# Patient Record
Sex: Male | Born: 1990 | Race: White | Hispanic: No | Marital: Married | Smoking: Never smoker
Health system: Southern US, Community
[De-identification: ages and names within clinical notes are randomized; demographics above are authoritative.]

## PROBLEM LIST (undated history)

## (undated) DIAGNOSIS — N200 Calculus of kidney: Secondary | ICD-10-CM

## (undated) HISTORY — PX: CYST REMOVAL PEDIATRIC: SHX6282

---

## 2013-07-10 ENCOUNTER — Encounter (HOSPITAL_COMMUNITY): Payer: Self-pay

## 2013-07-10 ENCOUNTER — Emergency Department (HOSPITAL_COMMUNITY)
Admission: EM | Admit: 2013-07-10 | Discharge: 2013-07-10 | Disposition: A | Discharge status: Home / Self Care | Payer: Self-pay | Attending: Emergency Medicine | Admitting: Emergency Medicine

## 2013-07-10 DIAGNOSIS — H60399 Other infective otitis externa, unspecified ear: Secondary | ICD-10-CM | POA: Insufficient documentation

## 2013-07-10 DIAGNOSIS — H6092 Unspecified otitis externa, left ear: Secondary | ICD-10-CM

## 2013-07-10 DIAGNOSIS — M436 Torticollis: Secondary | ICD-10-CM | POA: Insufficient documentation

## 2013-07-10 DIAGNOSIS — R599 Enlarged lymph nodes, unspecified: Secondary | ICD-10-CM | POA: Insufficient documentation

## 2013-07-10 DIAGNOSIS — H9210 Otorrhea, unspecified ear: Secondary | ICD-10-CM | POA: Insufficient documentation

## 2013-07-10 DIAGNOSIS — F17200 Nicotine dependence, unspecified, uncomplicated: Secondary | ICD-10-CM | POA: Insufficient documentation

## 2013-07-10 MED ORDER — NEOMYCIN-POLYMYXIN-HC 3.5-10000-1 OT SOLN
4.0000 [drp] | Freq: Once | OTIC | Status: AC
  Administered 2013-07-10: 4 [drp] via OTIC
  Filled 2013-07-10: qty 10

## 2013-07-10 MED ORDER — IBUPROFEN 600 MG PO TABS: 600.0000 mg | ORAL_TABLET | Freq: Four times a day (QID) | ORAL | Status: DC | PRN

## 2013-07-10 MED ORDER — IBUPROFEN 800 MG PO TABS
800.0000 mg | ORAL_TABLET | Freq: Once | ORAL | Status: AC
  Administered 2013-07-10: 800 mg via ORAL
  Filled 2013-07-10: qty 1

## 2013-07-10 MED ORDER — METHOCARBAMOL 500 MG PO TABS: 1000.0000 mg | ORAL_TABLET | Freq: Four times a day (QID) | ORAL | Status: AC

## 2013-07-10 NOTE — ED Provider Notes (Signed)
Medical screening examination/treatment/procedure(s) were performed by non-physician practitioner and as supervising physician I was immediately available for consultation/collaboration.   Mikaili Flippin M Lael Wetherbee, DO 07/10/13 2207 

## 2013-07-10 NOTE — ED Provider Notes (Signed)
CSN: [id number]     Arrival date & time 07/10/13  0804 History     First MD Initiated Contact with Patient 07/10/13 0818     Chief Complaint  Patient presents with  . Neck Pain   (Consider location/radiation/quality/duration/timing/severity/associated sxs/prior Treatment) HPI Comments: Miguel Spears is a 22 y.o. Male presenting with a one-day history of left-sided neck pain.  He was working at his job using a power washer when he developed gradual onset of left sided neck pain which became suddenly  worse while at lunch yesterday.  He has difficulty turning head secondary to pain and muscle spasm.  Additionally he describes a white to yellow watery discharge from his left ear and noticed a swollen lymph node underneath his left ear which also started yesterday.  He denies fevers or chills, no headache or sore throat, nasal congestion cough or shortness of breath.  He took Benadryl yesterday evening without relief of symptoms.   The history is provided by the patient.    History reviewed. No pertinent past medical history. Past Surgical History  Procedure Laterality Date  . Cyst removal pediatric     No family history on file. History  Substance Use Topics  . Smoking status: Current Every Day Smoker  . Smokeless tobacco: Not on file  . Alcohol Use: No    Review of Systems  Constitutional: Negative for fever.  HENT: Positive for neck pain, neck stiffness and ear discharge. Negative for ear pain, congestion, sore throat, facial swelling, rhinorrhea and tinnitus.   Eyes: Negative.   Respiratory: Negative for chest tightness and shortness of breath.   Cardiovascular: Negative for chest pain.  Gastrointestinal: Negative for nausea and abdominal pain.  Genitourinary: Negative.   Musculoskeletal: Negative for joint swelling and arthralgias.  Skin: Negative.  Negative for rash and wound.  Neurological: Negative for dizziness, weakness, light-headedness, numbness and headaches.   Psychiatric/Behavioral: Negative.     Allergies  Pineapple and Coconut flavor  Home Medications   Current Outpatient Rx  Name  Route  Sig  Dispense  Refill  . diphenhydrAMINE (BENADRYL) 25 mg capsule   Oral   Take 50 mg by mouth every 6 (six) hours as needed for itching.         Marland Kitchen ibuprofen (ADVIL,MOTRIN) 600 MG tablet   Oral   Take 1 tablet (600 mg total) by mouth every 6 (six) hours as needed for pain.   20 tablet   0   . methocarbamol (ROBAXIN) 500 MG tablet   Oral   Take 2 tablets (1,000 mg total) by mouth 4 (four) times daily.   40 tablet   0    BP 125/68  Pulse 54  Temp(Src) 98.2 F (36.8 C) (Oral)  Resp 20  Ht 5\' 11"  (1.803 m)  Wt 160 lb (72.576 kg)  BMI 22.33 kg/m2  SpO2 100% Physical Exam  Constitutional: He is oriented to person, place, and time. He appears well-developed and well-nourished.  HENT:  Head: Normocephalic and atraumatic.  Right Ear: Tympanic membrane and ear canal normal.  Left Ear: Tympanic membrane normal. There is drainage and tenderness. Tympanic membrane is not erythematous, not retracted and not bulging.  Nose: Mucosal edema and rhinorrhea present.  Mouth/Throat: Uvula is midline, oropharynx is clear and moist and mucous membranes are normal. No oropharyngeal exudate, posterior oropharyngeal edema, posterior oropharyngeal erythema or tonsillar abscesses.  Eyes: Conjunctivae are normal.  Neck: Muscular tenderness present. No rigidity. Decreased range of motion present.  Tender to palpation along  left SCM.  Increased pain along the SCM with leftward rotation of head.  Cardiovascular: Normal rate and normal heart sounds.   Pulmonary/Chest: Effort normal. No respiratory distress. He has no wheezes. He has no rales.  Abdominal: Soft. There is no tenderness.  Lymphadenopathy:    He has cervical adenopathy.  Neurological: He is alert and oriented to person, place, and time.  Skin: Skin is warm and dry. No rash noted.  Psychiatric: He  has a normal mood and affect.    ED Course   Procedures (including critical care time)  Labs Reviewed - No data to display No results found. 1. Otitis externa of left ear   2. Torticollis, acute     MDM  Pt was placed on Cortisporin drops for left ear, first dose given here in bottle given to patient.  He was encouraged ibuprofen and Robaxin for his muscle spasm, she had followed by gentle range of motion several times daily..  Followup anticipated.  The patient appears reasonably screened and/or stabilized for discharge and I doubt any other medical condition or other [facility] requiring further screening, evaluation, or treatment in the ED at this time prior to discharge.   Burgess Amor, PA-C 07/10/13 [phone]

## 2013-07-10 NOTE — ED Notes (Signed)
Pt reports has a knot on left side of neck since yesterday.  Reports history of "cysts" that have required surgery to remove.

## 2013-08-10 ENCOUNTER — Encounter (HOSPITAL_COMMUNITY): Payer: Self-pay | Admitting: *Deleted

## 2013-08-10 ENCOUNTER — Emergency Department (HOSPITAL_COMMUNITY): Payer: Self-pay

## 2013-08-10 ENCOUNTER — Emergency Department (HOSPITAL_COMMUNITY)
Admission: EM | Admit: 2013-08-10 | Discharge: 2013-08-11 | Disposition: A | Discharge status: Home / Self Care | Payer: Self-pay | Attending: Emergency Medicine | Admitting: Emergency Medicine

## 2013-08-10 DIAGNOSIS — R112 Nausea with vomiting, unspecified: Secondary | ICD-10-CM | POA: Insufficient documentation

## 2013-08-10 DIAGNOSIS — Y9239 Other specified sports and athletic area as the place of occurrence of the external cause: Secondary | ICD-10-CM | POA: Insufficient documentation

## 2013-08-10 DIAGNOSIS — F17200 Nicotine dependence, unspecified, uncomplicated: Secondary | ICD-10-CM | POA: Insufficient documentation

## 2013-08-10 DIAGNOSIS — S0285XA Fracture of orbit, unspecified, initial encounter for closed fracture: Secondary | ICD-10-CM

## 2013-08-10 DIAGNOSIS — Y9372 Activity, wrestling: Secondary | ICD-10-CM | POA: Insufficient documentation

## 2013-08-10 DIAGNOSIS — W219XXA Striking against or struck by unspecified sports equipment, initial encounter: Secondary | ICD-10-CM | POA: Insufficient documentation

## 2013-08-10 DIAGNOSIS — S0280XA Fracture of other specified skull and facial bones, unspecified side, initial encounter for closed fracture: Secondary | ICD-10-CM | POA: Insufficient documentation

## 2013-08-10 MED ORDER — ONDANSETRON HCL 4 MG/2ML IJ SOLN
4.0000 mg | Freq: Once | INTRAMUSCULAR | Status: AC
  Administered 2013-08-11: 4 mg via INTRAVENOUS
  Filled 2013-08-10: qty 2

## 2013-08-10 MED ORDER — MORPHINE SULFATE 4 MG/ML IJ SOLN
4.0000 mg | Freq: Once | INTRAMUSCULAR | Status: AC
  Administered 2013-08-11: 4 mg via INTRAVENOUS
  Filled 2013-08-10: qty 1

## 2013-08-10 NOTE — ED Notes (Signed)
Pt to department via EMS.  Per report, pt was in a wrestling match, during fight he was struck in the right eye with his knee.  Pt ambulatory at scene.  No loc.

## 2013-08-10 NOTE — ED Provider Notes (Signed)
CSN: [id number]     Arrival date & time 08/10/13  2217 History  This chart was scribed for Dione Booze, MD by Henri Medal, ED Scribe. This patient was seen in room APA09/APA09 and the patient's care was started at 11:45 PM.    Chief Complaint  Patient presents with  . Facial Injury   The history is provided by the patient and a parent. No language interpreter was used.   HPI Comments: Miguel Spears is a 22 y.o. male who presents to the Emergency Department complaining of constant, gradually worsening, "9/10" left sided facial pain onset today while wrestling with friends. Pt states that he was "suplexed" and that his knee struck him in his left eye. Pt reports associated left-sided facial numbness which is improving, nausea with 3 episodes of emesis, blurred vision in his left eye and "side-by-side" double vision onset after the injury. Pt is alert and oriented to location and time. He denies neck pain, back pain or any other symptoms.   History reviewed. No pertinent past medical history. Past Surgical History  Procedure Laterality Date  . Cyst removal pediatric     History reviewed. No pertinent family history. History  Substance Use Topics  . Smoking status: Current Every Day Smoker -- 1.00 packs/day  . Smokeless tobacco: Not on file  . Alcohol Use: Yes    Review of Systems  HENT: Negative for neck pain.        Left-sided facial pain and numbness.  Eyes: Positive for visual disturbance.  Gastrointestinal: Positive for nausea and vomiting.  Musculoskeletal: Negative for back pain.  All other systems reviewed and are negative.   Allergies  Pineapple and Coconut flavor  Home Medications  No current outpatient prescriptions on file.  Triage Vitals: BP 122/64  Pulse 62  Temp(Src) 98.2 F (36.8 C) (Oral)  Resp 14  SpO2 100%  Physical Exam  Nursing note and vitals reviewed. Constitutional: He is oriented to person, place, and time. He appears well-developed and  well-nourished. No distress.  Neck immobilized in a stiff cervical collar.  HENT:  Head: Normocephalic and atraumatic.  Ecchymosis and tenderness to the right infraorbital area. No limitation of EOM. Able to count fingers without difficulty.  Eyes: EOM are normal.  Neck: Neck supple. No tracheal deviation present.  Cardiovascular: Normal rate.   Pulmonary/Chest: Effort normal. No respiratory distress.  Musculoskeletal: Normal range of motion.  Neurological: He is alert and oriented to person, place, and time.  Skin: Skin is warm and dry.  Psychiatric: He has a normal mood and affect. His behavior is normal.    ED Course  Procedures (including critical care time)  DIAGNOSTIC STUDIES: Oxygen Saturation is 100% on RA, normal by my interpretation.    COORDINATION OF CARE: 11:50 PM-Discussed treatment plan which includes plan to receive Morphine and Zofran in the ED. Also discussed plan to obtain CT's of pt's head, neck and face, Pt agreed to plan.  Medications  ondansetron (ZOFRAN) injection 4 mg (4 mg Intravenous Given 08/11/13 0005)  morphine 4 MG/ML injection 4 mg (4 mg Intravenous Given 08/11/13 0005)   Labs Review Labs Reviewed - No data to display Imaging Review Ct Head Wo Contrast  08/11/2013   CLINICAL DATA:  Injury, right eye bruising, swelling.  EXAM: CT HEAD WITHOUT CONTRAST  CT MAXILLOFACIAL WITHOUT CONTRAST  CT CERVICAL SPINE WITHOUT CONTRAST  TECHNIQUE: Multidetector CT imaging of the head, cervical spine, and maxillofacial structures were performed using the standard protocol without intravenous contrast. Multiplanar  CT image reconstructions of the cervical spine and maxillofacial structures were also generated.  COMPARISON:  None.  FINDINGS: CT HEAD FINDINGS  No acute intracranial abnormality. Specifically, no hemorrhage, hydrocephalus, mass lesion, acute infarction, or significant intracranial injury. No acute calvarial abnormality.  CT MAXILLOFACIAL FINDINGS  There is a  fracture through the floor of the right orbit. No evidence of muscle entrapment. Small amount of blood in the right maxillary sinus. 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 Globe is intact. No additional orbital or facial fracture. Mild mucosal thickening in the ethmoid air cells and maxillary sinuses.  CT CERVICAL SPINE FINDINGS  Normal alignment. Prevertebral soft tissues are normal. Disc spaces are maintained. No fracture. No epidural or paraspinal hematoma.  IMPRESSION: CT HEAD IMPRESSION  No intracranial abnormality.  CT MAXILLOFACIAL IMPRESSION  Fracture through the floor of the right orbit.  CT CERVICAL SPINE IMPRESSION  No bony abnormality.   Electronically Signed   By: Charlett Nose M.D.   On: 08/11/2013 00:59   Ct Cervical Spine Wo Contrast  08/11/2013   CLINICAL DATA:  Injury, right eye bruising, swelling.  EXAM: CT HEAD WITHOUT CONTRAST  CT MAXILLOFACIAL WITHOUT CONTRAST  CT CERVICAL SPINE WITHOUT CONTRAST  TECHNIQUE: Multidetector CT imaging of the head, cervical spine, and maxillofacial structures were performed using the standard protocol without intravenous contrast. Multiplanar CT image reconstructions of the cervical spine and maxillofacial structures were also generated.  COMPARISON:  None.  FINDINGS: CT HEAD FINDINGS  No acute intracranial abnormality. Specifically, no hemorrhage, hydrocephalus, mass lesion, acute infarction, or significant intracranial injury. No acute calvarial abnormality.  CT MAXILLOFACIAL FINDINGS  There is a fracture through the floor of the right orbit. No evidence of muscle entrapment. Small amount of blood in the right maxillary sinus. 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 Globe is intact. No additional orbital or facial fracture. Mild mucosal thickening in the ethmoid air cells and maxillary sinuses.  CT CERVICAL SPINE FINDINGS  Normal alignment. Prevertebral soft tissues are normal. Disc spaces are maintained. No fracture. No epidural or paraspinal hematoma.  IMPRESSION: CT HEAD  IMPRESSION  No intracranial abnormality.  CT MAXILLOFACIAL IMPRESSION  Fracture through the floor of the right orbit.  CT CERVICAL SPINE IMPRESSION  No bony abnormality.   Electronically Signed   By: Charlett Nose M.D.   On: 08/11/2013 00:59   Ct Maxillofacial Wo Cm  08/11/2013   CLINICAL DATA:  Injury, right eye bruising, swelling.  EXAM: CT HEAD WITHOUT CONTRAST  CT MAXILLOFACIAL WITHOUT CONTRAST  CT CERVICAL SPINE WITHOUT CONTRAST  TECHNIQUE: Multidetector CT imaging of the head, cervical spine, and maxillofacial structures were performed using the standard protocol without intravenous contrast. Multiplanar CT image reconstructions of the cervical spine and maxillofacial structures were also generated.  COMPARISON:  None.  FINDINGS: CT HEAD FINDINGS  No acute intracranial abnormality. Specifically, no hemorrhage, hydrocephalus, mass lesion, acute infarction, or significant intracranial injury. No acute calvarial abnormality.  CT MAXILLOFACIAL FINDINGS  There is a fracture through the floor of the right orbit. No evidence of muscle entrapment. Small amount of blood in the right maxillary sinus. 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 Globe is intact. No additional orbital or facial fracture. Mild mucosal thickening in the ethmoid air cells and maxillary sinuses.  CT CERVICAL SPINE FINDINGS  Normal alignment. Prevertebral soft tissues are normal. Disc spaces are maintained. No fracture. No epidural or paraspinal hematoma.  IMPRESSION: CT HEAD IMPRESSION  No intracranial abnormality.  CT MAXILLOFACIAL IMPRESSION  Fracture through the floor of the right  orbit.  CT CERVICAL SPINE IMPRESSION  No bony abnormality.   Electronically Signed   By: Charlett Nose M.D.   On: 08/11/2013 00:59    MDM   1. Closed fracture of right orbit, initial encounter    Head injury and injury to the right preorbital area. He will be sent for CT scans for further evaluation.  CT shows fracture of the floor of the right orbit without  entrapment of extraocular muscles. He is referred to ENT for followup and is discharged with prescription for oxycodone-acetaminophen for pain and metoclopramide for nausea.  I personally performed the services described in this documentation, which was scribed in my presence. The recorded information has been reviewed and is accurate.     Dione Booze, MD 08/11/13 [phone]

## 2013-08-10 NOTE — ED Notes (Signed)
Pt demanding to be removed from backboard.  Explained to pt that he needed to be at least preliminarily examined before removing spinal immobilization.  Pt insisting to be off backboard without exam and was uncooperative with requests to maintain spinal allignment.  Pt removed from board, but moved himself off before staff could implement proper spinal alignment.

## 2013-08-11 MED ORDER — METOCLOPRAMIDE HCL 10 MG PO TABS: 10.0000 mg | ORAL_TABLET | Freq: Four times a day (QID) | ORAL | Status: DC | PRN

## 2013-08-11 MED ORDER — OXYCODONE-ACETAMINOPHEN 5-325 MG PO TABS: 1.0000 | ORAL_TABLET | ORAL | Status: DC | PRN

## 2013-08-14 ENCOUNTER — Emergency Department (HOSPITAL_COMMUNITY)
Admission: EM | Admit: 2013-08-14 | Discharge: 2013-08-14 | Disposition: A | Discharge status: Home / Self Care | Payer: Self-pay | Attending: Emergency Medicine | Admitting: Emergency Medicine

## 2013-08-14 ENCOUNTER — Encounter (HOSPITAL_COMMUNITY): Payer: Self-pay

## 2013-08-14 DIAGNOSIS — S0292XD Unspecified fracture of facial bones, subsequent encounter for fracture with routine healing: Secondary | ICD-10-CM

## 2013-08-14 DIAGNOSIS — H53149 Visual discomfort, unspecified: Secondary | ICD-10-CM | POA: Insufficient documentation

## 2013-08-14 DIAGNOSIS — F17200 Nicotine dependence, unspecified, uncomplicated: Secondary | ICD-10-CM | POA: Insufficient documentation

## 2013-08-14 DIAGNOSIS — Z09 Encounter for follow-up examination after completed treatment for conditions other than malignant neoplasm: Secondary | ICD-10-CM

## 2013-08-14 DIAGNOSIS — Z4789 Encounter for other orthopedic aftercare: Secondary | ICD-10-CM | POA: Insufficient documentation

## 2013-08-14 NOTE — ED Notes (Signed)
appt made for pt tom at 1500, pt called and made aware of appt time and location of appt

## 2013-08-14 NOTE — ED Notes (Signed)
States he took two pain pills an hour PTA, pt was to go back to work today but pt is on ladders for work and still has balance problems, continues to have numbness to right side of face and double vision at times

## 2013-08-14 NOTE — ED Provider Notes (Signed)
CSN: [id number]     Arrival date & time 08/14/13  1051 History   First MD Initiated Contact with Patient 08/14/13 1125     Chief Complaint  Patient presents with  . Eye Injury   (Consider location/radiation/quality/duration/timing/severity/associated sxs/prior Treatment) The history is provided by the patient.   Miguel Spears is a 22 y.o. male who presents to the ED with facial pain after an injury last week while in wrestling match. He was evaluated here for an orbital fracture. He was to follow up with Dr. Christain Sacramento but has not. Patient complains of numbness to the face. 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 He states that is to painful to do and he needs a work note.   History reviewed. No pertinent past medical history. Past Surgical History  Procedure Laterality Date  . Cyst removal pediatric     No family history on file. History  Substance Use Topics  . Smoking status: Current Every Day Smoker -- 1.00 packs/day  . Smokeless tobacco: Not on file  . Alcohol Use: Yes    Review of Systems  Constitutional: Negative for fever and chills.  HENT: Positive for facial swelling. Negative for trouble swallowing and neck pain.   Eyes: Positive for photophobia, pain, redness and visual disturbance.  Respiratory: Negative for shortness of breath.   Gastrointestinal: Negative for nausea and vomiting.  Musculoskeletal: Negative for gait problem.  Skin: Positive for wound.  Neurological: Negative for dizziness and headaches.  Psychiatric/Behavioral: The patient is not nervous/anxious.     Allergies  Pineapple and Coconut flavor  Home Medications   Current Outpatient Rx  Name  Route  Sig  Dispense  Refill  . metoCLOPramide (REGLAN) 10 MG tablet   Oral   Take 1 tablet (10 mg total) by mouth every 6 (six) hours as needed (Nausea).   30 tablet   0   . oxyCODONE-acetaminophen (PERCOCET/ROXICET) 5-325 MG per tablet   Oral   Take 1 tablet by mouth  every 4 (four) hours as needed for pain.   20 tablet   0   . oxyCODONE-acetaminophen (PERCOCET/ROXICET) 5-325 MG per tablet   Oral   Take 1 tablet by mouth every 4 (four) hours as needed for pain.   6 tablet   0    BP 146/81  Pulse 72  Temp(Src) 98.1 F (36.7 C) (Oral)  Resp 20  Ht 5\' 11"  (1.803 m)  Wt 155 lb (70.308 kg)  BMI 21.63 kg/m2  SpO2 100% Physical Exam  Nursing note and vitals reviewed. Constitutional: He is oriented to person, place, and time. He appears well-developed and well-nourished. No distress.  HENT:  Head: Head is with contusion.    There is ecchymosis noted under the right eye. Tender on exam. Patient with known orbital fracture from previous visit. Decreases sensation right side of face.   Eyes: EOM are normal.  Neck: Neck supple.  Cardiovascular: Normal rate.   Pulmonary/Chest: Effort normal.  Musculoskeletal: Normal range of motion.  Neurological: He is alert and oriented to person, place, and time. No cranial nerve deficit.  Skin: Skin is warm and dry.  Psychiatric: He has a normal mood and affect. His behavior is normal.    ED Course  Procedures  MDM  22 y.o. male here for follow up of orbital fracture right. Request work note and assistance with follow up appointment with ENT. Call to Dr. Luanne Bras office and appointment made for tomorrow 08/15/13 at 3pm.  Patient agrees to go. He will continue his pain medication as needed. Work note given for today. Patient stable for discharge to see Dr. Christain Sacramento tomorrow.      [facility] Orlene Och, [place] 08/14/13 1421

## 2013-08-14 NOTE — ED Notes (Signed)
Pt reports that he was "suplexed" last Saturday during a wrestling match, has known orbital fx, unable to follow up with dr.teo.  Cont. To have pain and numbness to face.  Also needs a work note.

## 2013-08-15 ENCOUNTER — Ambulatory Visit (INDEPENDENT_AMBULATORY_CARE_PROVIDER_SITE_OTHER): Payer: Self-pay | Admitting: Otolaryngology

## 2013-08-15 DIAGNOSIS — S0230XA Fracture of orbital floor, unspecified side, initial encounter for closed fracture: Secondary | ICD-10-CM

## 2013-08-15 NOTE — ED Provider Notes (Signed)
Medical screening examination/treatment/procedure(s) were performed by non-physician practitioner and as supervising physician I was immediately available for consultation/collaboration.   Candyce Churn, MD 08/15/13 [phone]

## 2013-08-23 MED FILL — Oxycodone w/ Acetaminophen Tab 5-325 MG: ORAL | Qty: 6 | Status: AC

## 2013-08-29 ENCOUNTER — Ambulatory Visit (INDEPENDENT_AMBULATORY_CARE_PROVIDER_SITE_OTHER): Payer: Self-pay | Admitting: Otolaryngology

## 2015-08-15 ENCOUNTER — Encounter (HOSPITAL_COMMUNITY): Payer: Self-pay | Admitting: Emergency Medicine

## 2015-08-15 ENCOUNTER — Emergency Department (HOSPITAL_COMMUNITY)
Admission: EM | Admit: 2015-08-15 | Discharge: 2015-08-16 | Disposition: A | Discharge status: Home / Self Care | Payer: Self-pay | Attending: Emergency Medicine | Admitting: Emergency Medicine

## 2015-08-15 ENCOUNTER — Emergency Department (HOSPITAL_COMMUNITY): Payer: Self-pay

## 2015-08-15 DIAGNOSIS — F419 Anxiety disorder, unspecified: Secondary | ICD-10-CM | POA: Insufficient documentation

## 2015-08-15 DIAGNOSIS — R079 Chest pain, unspecified: Secondary | ICD-10-CM | POA: Insufficient documentation

## 2015-08-15 DIAGNOSIS — Z720 Tobacco use: Secondary | ICD-10-CM | POA: Insufficient documentation

## 2015-08-15 LAB — CBC
HEMATOCRIT: 39.8 % (ref 39.0–52.0)
Hemoglobin: 13.8 g/dL (ref 13.0–17.0)
MCH: 29.7 pg (ref 26.0–34.0)
MCHC: 34.7 g/dL (ref 30.0–36.0)
MCV: 85.8 fL (ref 78.0–100.0)
Platelets: 182 10*3/uL (ref 150–400)
RBC: 4.64 MIL/uL (ref 4.22–5.81)
RDW: 12.9 % (ref 11.5–15.5)
WBC: 6.2 10*3/uL (ref 4.0–10.5)

## 2015-08-15 LAB — BASIC METABOLIC PANEL
Anion gap: 10 (ref 5–15)
BUN: 13 mg/dL (ref 6–20)
CHLORIDE: 105 mmol/L (ref 101–111)
CO2: 22 mmol/L (ref 22–32)
Calcium: 9.4 mg/dL (ref 8.9–10.3)
Creatinine, Ser: 1.12 mg/dL (ref 0.61–1.24)
GFR calc Af Amer: >60 mL/min (ref >60)
GFR calc non Af Amer: >60 mL/min (ref >60)
GLUCOSE: 97 mg/dL (ref 65–99)
POTASSIUM: 3.4 mmol/L — AB (ref 3.5–5.1)
Sodium: 137 mmol/L (ref 135–145)

## 2015-08-15 LAB — I-STAT TROPONIN, ED: Troponin i, poc: 0 ng/mL (ref 0.00–0.08)

## 2015-08-15 LAB — D-DIMER, QUANTITATIVE: D-Dimer, Quant: <0.27 ug/mL-FEU (ref 0.00–0.48)

## 2015-08-15 MED ORDER — ASPIRIN 81 MG PO CHEW
324.0000 mg | CHEWABLE_TABLET | Freq: Once | ORAL | Status: AC
  Administered 2015-08-16: 324 mg via ORAL
  Filled 2015-08-15: qty 4

## 2015-08-15 MED ORDER — LORAZEPAM 2 MG/ML IJ SOLN
INTRAMUSCULAR | Status: AC
  Filled 2015-08-15: qty 1

## 2015-08-15 MED ORDER — LORAZEPAM 2 MG/ML IJ SOLN
1.0000 mg | Freq: Once | INTRAMUSCULAR | Status: AC
  Administered 2015-08-15: 1 mg via INTRAVENOUS

## 2015-08-15 NOTE — Discharge Instructions (Signed)
If you were given medicines take as directed.  If you are on coumadin or contraceptives realize their levels and effectiveness is altered by many different medicines.  If you have any reaction (rash, tongues swelling, other) to the medicines stop taking and see a physician.    If your blood pressure was elevated in the ER make sure you follow up for management with a primary doctor or return for chest pain, shortness of breath or stroke symptoms.  Please follow up as directed and return to the ER or see a physician for new or worsening symptoms.  Thank you. Filed Vitals:   08/15/15 2200 08/15/15 2230 08/15/15 2245 08/15/15 2300  BP: 142/117 147/89  135/77  Pulse: 77  77 75  Resp: SpO2: 100%  94% 95%

## 2015-08-15 NOTE — ED Notes (Signed)
Pt presented to triage appearing to be hyperventalating, stated he was having chest pain and his hands were numb. States pain is 10/10 and started this morning.
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
## 2015-08-16 LAB — I-STAT TROPONIN, ED: TROPONIN I, POC: 0 ng/mL (ref 0.00–0.08)

## 2015-08-16 NOTE — ED Notes (Signed)
Wife called stating need of work note to make it to follow up with PMD. Notified that note would be at registration desk for pick up.

## 2015-09-16 ENCOUNTER — Emergency Department (HOSPITAL_COMMUNITY)
Admission: EM | Admit: 2015-09-16 | Discharge: 2015-09-16 | Disposition: A | Discharge status: Home / Self Care | Payer: Self-pay | Attending: Emergency Medicine | Admitting: Emergency Medicine

## 2015-09-16 ENCOUNTER — Encounter (HOSPITAL_COMMUNITY): Payer: Self-pay | Admitting: Emergency Medicine

## 2015-09-16 ENCOUNTER — Emergency Department (HOSPITAL_COMMUNITY): Payer: Self-pay

## 2015-09-16 DIAGNOSIS — M549 Dorsalgia, unspecified: Secondary | ICD-10-CM | POA: Insufficient documentation

## 2015-09-16 DIAGNOSIS — Z720 Tobacco use: Secondary | ICD-10-CM | POA: Insufficient documentation

## 2015-09-16 DIAGNOSIS — R197 Diarrhea, unspecified: Secondary | ICD-10-CM | POA: Insufficient documentation

## 2015-09-16 DIAGNOSIS — R112 Nausea with vomiting, unspecified: Secondary | ICD-10-CM | POA: Insufficient documentation

## 2015-09-16 DIAGNOSIS — N410 Acute prostatitis: Secondary | ICD-10-CM | POA: Insufficient documentation

## 2015-09-16 LAB — COMPREHENSIVE METABOLIC PANEL
ALK PHOS: 46 U/L (ref 38–126)
ALT: 19 U/L (ref 17–63)
ANION GAP: 6 (ref 5–15)
AST: 23 U/L (ref 15–41)
Albumin: 3.9 g/dL (ref 3.5–5.0)
BUN: 12 mg/dL (ref 6–20)
CALCIUM: 9.1 mg/dL (ref 8.9–10.3)
CHLORIDE: 107 mmol/L (ref 101–111)
CO2: 25 mmol/L (ref 22–32)
Creatinine, Ser: 0.83 mg/dL (ref 0.61–1.24)
GFR calc non Af Amer: >60 mL/min (ref >60)
Glucose, Bld: 101 mg/dL — ABNORMAL HIGH (ref 65–99)
Potassium: 3.9 mmol/L (ref 3.5–5.1)
SODIUM: 138 mmol/L (ref 135–145)
Total Bilirubin: 0.4 mg/dL (ref 0.3–1.2)
Total Protein: 6.9 g/dL (ref 6.5–8.1)

## 2015-09-16 LAB — URINALYSIS, ROUTINE W REFLEX MICROSCOPIC
BILIRUBIN URINE: NEGATIVE
Glucose, UA: NEGATIVE mg/dL
Hgb urine dipstick: NEGATIVE
Ketones, ur: NEGATIVE mg/dL
Leukocytes, UA: NEGATIVE
Nitrite: NEGATIVE
PROTEIN: NEGATIVE mg/dL
SPECIFIC GRAVITY, URINE: 1.025 (ref 1.005–1.030)
UROBILINOGEN UA: 0.2 mg/dL (ref 0.0–1.0)
pH: 6 (ref 5.0–8.0)

## 2015-09-16 LAB — CBC WITH DIFFERENTIAL/PLATELET
Basophils Absolute: 0 10*3/uL (ref 0.0–0.1)
Basophils Relative: 0 %
EOS ABS: 0.1 10*3/uL (ref 0.0–0.7)
EOS PCT: 3 %
HCT: 41.5 % (ref 39.0–52.0)
Hemoglobin: 14.2 g/dL (ref 13.0–17.0)
LYMPHS ABS: 1.4 10*3/uL (ref 0.7–4.0)
Lymphocytes Relative: 33 %
MCH: 29.8 pg (ref 26.0–34.0)
MCHC: 34.2 g/dL (ref 30.0–36.0)
MCV: 87.2 fL (ref 78.0–100.0)
MONO ABS: 0.7 10*3/uL (ref 0.1–1.0)
MONOS PCT: 16 %
Neutro Abs: 2.1 10*3/uL (ref 1.7–7.7)
Neutrophils Relative %: 48 %
PLATELETS: 151 10*3/uL (ref 150–400)
RBC: 4.76 MIL/uL (ref 4.22–5.81)
RDW: 12.9 % (ref 11.5–15.5)
WBC: 4.4 10*3/uL (ref 4.0–10.5)

## 2015-09-16 LAB — LIPASE, BLOOD: LIPASE: 27 U/L (ref 22–51)

## 2015-09-16 MED ORDER — SODIUM CHLORIDE 0.9 % IV SOLN
INTRAVENOUS | Status: DC
  Administered 2015-09-16: 09:00:00 via INTRAVENOUS

## 2015-09-16 MED ORDER — HYDROCODONE-ACETAMINOPHEN 5-325 MG PO TABS: 1.0000 | ORAL_TABLET | Freq: Four times a day (QID) | ORAL | Status: DC | PRN

## 2015-09-16 MED ORDER — FENTANYL CITRATE (PF) 100 MCG/2ML IJ SOLN
50.0000 ug | Freq: Once | INTRAMUSCULAR | Status: AC
  Administered 2015-09-16: 50 ug via INTRAVENOUS
  Filled 2015-09-16: qty 2

## 2015-09-16 MED ORDER — ONDANSETRON HCL 4 MG/2ML IJ SOLN
4.0000 mg | Freq: Once | INTRAMUSCULAR | Status: AC
  Administered 2015-09-16: 4 mg via INTRAVENOUS
  Filled 2015-09-16: qty 2

## 2015-09-16 MED ORDER — IOHEXOL 300 MG/ML  SOLN
100.0000 mL | Freq: Once | INTRAMUSCULAR | Status: AC | PRN
  Administered 2015-09-16: 100 mL via INTRAVENOUS

## 2015-09-16 MED ORDER — DOXYCYCLINE HYCLATE 100 MG PO CAPS: 100.0000 mg | ORAL_CAPSULE | Freq: Two times a day (BID) | ORAL | Status: DC

## 2015-09-16 MED ORDER — SODIUM CHLORIDE 0.9 % IV BOLUS (SEPSIS)
1000.0000 mL | Freq: Once | INTRAVENOUS | Status: AC
Start: 2015-09-16 — End: 2015-09-16
  Administered 2015-09-16: 1000 mL via INTRAVENOUS

## 2015-09-16 MED ORDER — DOXYCYCLINE HYCLATE 100 MG PO TABS
100.0000 mg | ORAL_TABLET | Freq: Once | ORAL | Status: AC
  Administered 2015-09-16: 100 mg via ORAL
  Filled 2015-09-16: qty 1

## 2015-09-16 MED ORDER — DEXTROSE 5 % IV SOLN
1.0000 g | Freq: Once | INTRAVENOUS | Status: AC
  Administered 2015-09-16: 1 g via INTRAVENOUS
  Filled 2015-09-16: qty 10

## 2015-09-16 NOTE — Discharge Instructions (Signed)
Prostatitis Prostatitis is redness, soreness, and puffiness (swelling) of the prostate gland. The prostate gland is the walnut-sized gland located just below your bladder. HOME CARE:   Take all medicines as told by your doctor.  Take warm-water baths (sitz baths) as told by your doctor. GET HELP IF:  Your symptoms get worse, not better.  You have a fever. GET HELP RIGHT AWAY IF:   You have chills.  You feel sick to your stomach (nauseous) or like you will throw up (vomit).  You feel lightheaded or like you will pass out (faint).  You are unable to pee (urinate).  You have blood or blood clumps (clots) in your pee (urine). MAKE SURE YOU:  Understand these instructions.  Will watch your condition.  Will get help right away if you are not doing well or get worse.   This information is not intended to replace advice given to you by your health care provider. Make sure you discuss any questions you have with your health care provider.   Document Released: 05/15/2012 Document Revised: 12/05/2014 Document Reviewed: 06/03/2013 Elsevier Interactive Patient Education 2016 [employer]  Take antibiotic as directed important to take it for the next 10 days. Would expect some improvement over the next several days. Work note provided. Return for any new or worse symptoms.

## 2015-09-16 NOTE — ED Notes (Signed)
Pt stated pain started yest am with Rt sided flank pain - now pain has also moved to front - in both lower quads- pain with urinartion - has to push to void

## 2015-09-16 NOTE — ED Provider Notes (Signed)
CSN: [id number]     Arrival date & time 09/16/15  0709 History   First MD Initiated Contact with Patient 09/16/15 [phone]     Chief Complaint  Patient presents with  . Flank Pain     (Consider location/radiation/quality/duration/timing/severity/associated sxs/prior Treatment) Patient is a 24 y.o. male presenting with flank pain. The history is provided by the patient.  Flank Pain Associated symptoms include abdominal pain. Pertinent negatives include no chest pain, no headaches and no shortness of breath.   Patient with onset of back pain bilaterally yesterday today got bilateral flank pain radiating into both lower quadrants of the abdomen. Pain 6 out of 10. Associated with some nausea vomiting and some loose bowel movements. No history of kidney stones. But there is a family history of it. No fevers. Patient states that it's uncomfortable to urinate gets the pain increases with urination. Denies any penile discharge or sores or any testicular swelling or pain. Pain is described as a sharp ache.       History reviewed. No pertinent past medical history. Past Surgical History  Procedure Laterality Date  . Cyst removal pediatric     History reviewed. No pertinent family history. Social History  Substance Use Topics  . Smoking status: Current Every Day Smoker -- 1.00 packs/day  . Smokeless tobacco: None  . Alcohol Use: Yes    Review of Systems  Constitutional: Negative for fever.  HENT: Negative for congestion.   Eyes: Negative for redness.  Respiratory: Negative for shortness of breath.   Cardiovascular: Negative for chest pain.  Gastrointestinal: Positive for nausea, vomiting, abdominal pain and diarrhea.  Genitourinary: Positive for dysuria and flank pain. Negative for discharge, scrotal swelling, genital sores and testicular pain.  Musculoskeletal: Positive for back pain.  Neurological: Negative for headaches.  Hematological: Does not bruise/bleed easily.   Psychiatric/Behavioral: Negative for confusion.      Allergies  Pineapple and Coconut flavor  Home Medications   Prior to Admission medications   Not on File   BP 121/73 mmHg  Pulse 57  Temp(Src) 97.5 F (36.4 C) (Oral)  Resp 18  Ht 5\' 11"  (1.803 m)  Wt 160 lb (72.576 kg)  BMI 22.33 kg/m2  SpO2 99% Physical Exam  Constitutional: He is oriented to person, place, and time. He appears well-developed and well-nourished. No distress.  HENT:  Head: Normocephalic and atraumatic.  Mouth/Throat: Oropharynx is clear and moist.  Eyes: Conjunctivae and EOM are normal. Pupils are equal, round, and reactive to light.  Neck: Normal range of motion. Neck supple.  Cardiovascular: Normal rate, regular rhythm and normal heart sounds.   No murmur heard. Pulmonary/Chest: Effort normal and breath sounds normal. No respiratory distress.  Abdominal: Soft. Bowel sounds are normal. There is no tenderness.  Genitourinary: Penis normal.  Testicles are nontender no swelling no mass. Circumcised. No groin hernia. No adenopathy. No discharge.  Musculoskeletal: Normal range of motion.  Neurological: He is alert and oriented to person, place, and time. No cranial nerve deficit. He exhibits normal muscle tone. Coordination normal.  Skin: Skin is warm. No rash noted.  Nursing note and vitals reviewed.   ED Course  Procedures (including critical care time) Labs Review Labs Reviewed  COMPREHENSIVE METABOLIC PANEL - Abnormal; Notable for the following:    Glucose, Bld 101 (*)    All other components within normal limits  CBC WITH DIFFERENTIAL/PLATELET  URINALYSIS, ROUTINE W REFLEX MICROSCOPIC (NOT AT [facility])  LIPASE, BLOOD   Results for orders placed or performed during  the hospital encounter of 09/16/15  CBC with Differential (PNL)  Result Value Ref Range   WBC 4.4 4.0 - 10.5 K/uL   RBC 4.76 4.22 - 5.81 MIL/uL   Hemoglobin 14.2 13.0 - 17.0 g/dL   HCT 16.1 09.6 - 04.5 %   MCV 87.2 78.0 - 100.0  fL   MCH 29.8 26.0 - 34.0 pg   MCHC 34.2 30.0 - 36.0 g/dL   RDW 40.9 81.1 - 91.4 %   Platelets 151 150 - 400 K/uL   Neutrophils Relative % 48 %   Neutro Abs 2.1 1.7 - 7.7 K/uL   Lymphocytes Relative 33 %   Lymphs Abs 1.4 0.7 - 4.0 K/uL   Monocytes Relative 16 %   Monocytes Absolute 0.7 0.1 - 1.0 K/uL   Eosinophils Relative 3 %   Eosinophils Absolute 0.1 0.0 - 0.7 K/uL   Basophils Relative 0 %   Basophils Absolute 0.0 0.0 - 0.1 K/uL  Comprehensive metabolic panel  Result Value Ref Range   Sodium 138 135 - 145 mmol/L   Potassium 3.9 3.5 - 5.1 mmol/L   Chloride 107 101 - 111 mmol/L   CO2 25 22 - 32 mmol/L   Glucose, Bld 101 (H) 65 - 99 mg/dL   BUN 12 6 - 20 mg/dL   Creatinine, Ser 7.82 0.61 - 1.24 mg/dL   Calcium 9.1 8.9 - 95.6 mg/dL   Total Protein 6.9 6.5 - 8.1 g/dL   Albumin 3.9 3.5 - 5.0 g/dL   AST 23 15 - 41 U/L   ALT 19 17 - 63 U/L   Alkaline Phosphatase 46 38 - 126 U/L   Total Bilirubin 0.4 0.3 - 1.2 mg/dL   GFR calc non Af Amer >60 >60 mL/min   GFR calc Af Amer >60 >60 mL/min   Anion gap 6 5 - 15  Urinalysis, Routine w reflex microscopic (not at [facility])  Result Value Ref Range   Color, Urine YELLOW YELLOW   APPearance CLEAR CLEAR   Specific Gravity, Urine 1.025 1.005 - 1.030   pH 6.0 5.0 - 8.0   Glucose, UA NEGATIVE NEGATIVE mg/dL   Hgb urine dipstick NEGATIVE NEGATIVE   Bilirubin Urine NEGATIVE NEGATIVE   Ketones, ur NEGATIVE NEGATIVE mg/dL   Protein, ur NEGATIVE NEGATIVE mg/dL   Urobilinogen, UA 0.2 0.0 - 1.0 mg/dL   Nitrite NEGATIVE NEGATIVE   Leukocytes, UA NEGATIVE NEGATIVE  Lipase, blood  Result Value Ref Range   Lipase 27 22 - 51 U/L     Imaging Review Ct Abdomen Pelvis W Contrast  09/16/2015  CLINICAL DATA:  24 year old male with right-sided flank pain and dysuria EXAM: CT ABDOMEN AND PELVIS WITH CONTRAST TECHNIQUE: Multidetector CT imaging of the abdomen and pelvis was performed using the standard protocol following bolus administration of  intravenous contrast. CONTRAST:  OMNIPAQUE IOHEXOL 300 MG/ML  SOLN COMPARISON:  MRI lumbar spine 01/09/2015 FINDINGS: Lower Chest: The lung bases are clear. Visualized cardiac structures are within normal limits for size. No pericardial effusion. Unremarkable visualized distal thoracic esophagus. Abdomen: Unremarkable CT appearance of the stomach, duodenum, spleen, adrenal glands and pancreas. Normal hepatic contour and morphology. No discrete hepatic lesion. Portal veins are patent. Gallbladder is unremarkable. No intra or extrahepatic biliary ductal dilatation. Unremarkable appearance of the bilateral kidneys. No focal solid lesion, hydronephrosis or nephrolithiasis. No evidence of obstruction or focal bowel wall thickening. Normal appendix in the right lower quadrant. The terminal ileum is unremarkable. No free fluid or suspicious adenopathy. Pelvis:  The bladder is decompressed. The seminal vesicles and prostate gland appears slightly edematous and ill-defined. There is also a suggestion of hyper enhancement. There may be a small amounts of fluid layering dependently within the pelvis. Bones/Soft Tissues: No acute fracture or aggressive appearing lytic or blastic osseous lesion. Vascular: No significant atherosclerotic vascular disease, aneurysmal dilatation or acute abnormality. Two small vascular phleboliths are noted in the left anatomic pelvis. IMPRESSION: Ill-defined, edematous and slightly hypervascular prostate gland and seminal vesicles. This constellation of findings suggests acute prostatitis. Trace free fluid in the pelvis is likely reactive. No evidence of hydronephrosis or nephroureterolithiasis. Electronically Signed   By: Malachy Moan M.D.   On: 09/16/2015 08:53   I have personally reviewed and evaluated these images and lab results as part of my medical decision-making.   EKG Interpretation None      MDM   Final diagnoses:  Acute prostatitis    CT scan seems to be  consistent with prostate infection. Although no leukocytosis no white blood cells in the urine. No other good explanation based on CAT scan for patient's symptoms. Will treat here with 1 g of Rocephin and continued treatment with doxycycline at home. We'll give first dose of doxycycline here. Patient's vital signs without any toxic findings no fever not tachycardic not hypotensive.    Vanetta Mulders, MD 09/16/15 1043

## 2015-09-30 ENCOUNTER — Emergency Department (HOSPITAL_COMMUNITY)
Admission: EM | Admit: 2015-09-30 | Discharge: 2015-09-30 | Disposition: A | Discharge status: Home / Self Care | Payer: BLUE CROSS/BLUE SHIELD | Attending: Emergency Medicine | Admitting: Emergency Medicine

## 2015-09-30 ENCOUNTER — Encounter (HOSPITAL_COMMUNITY): Payer: Self-pay

## 2015-09-30 DIAGNOSIS — K625 Hemorrhage of anus and rectum: Secondary | ICD-10-CM | POA: Insufficient documentation

## 2015-09-30 DIAGNOSIS — Z720 Tobacco use: Secondary | ICD-10-CM | POA: Diagnosis not present

## 2015-09-30 DIAGNOSIS — R1032 Left lower quadrant pain: Secondary | ICD-10-CM | POA: Diagnosis not present

## 2015-09-30 LAB — PROTIME-INR
INR: 1.18 (ref 0.00–1.49)
Prothrombin Time: 15.2 seconds (ref 11.6–15.2)

## 2015-09-30 LAB — COMPREHENSIVE METABOLIC PANEL
ALK PHOS: 46 U/L (ref 38–126)
ALT: 26 U/L (ref 17–63)
AST: 30 U/L (ref 15–41)
Albumin: 4.7 g/dL (ref 3.5–5.0)
Anion gap: 8 (ref 5–15)
BUN: 18 mg/dL (ref 6–20)
CALCIUM: 9.5 mg/dL (ref 8.9–10.3)
CHLORIDE: 104 mmol/L (ref 101–111)
CO2: 25 mmol/L (ref 22–32)
CREATININE: 0.97 mg/dL (ref 0.61–1.24)
GFR calc non Af Amer: >60 mL/min (ref >60)
GLUCOSE: 92 mg/dL (ref 65–99)
Potassium: 3.9 mmol/L (ref 3.5–5.1)
SODIUM: 137 mmol/L (ref 135–145)
Total Bilirubin: 1.1 mg/dL (ref 0.3–1.2)
Total Protein: 7.4 g/dL (ref 6.5–8.1)

## 2015-09-30 LAB — CBC
HCT: 40 % (ref 39.0–52.0)
Hemoglobin: 13.6 g/dL (ref 13.0–17.0)
MCH: 29.6 pg (ref 26.0–34.0)
MCHC: 34 g/dL (ref 30.0–36.0)
MCV: 87.1 fL (ref 78.0–100.0)
PLATELETS: 205 10*3/uL (ref 150–400)
RBC: 4.59 MIL/uL (ref 4.22–5.81)
RDW: 13.1 % (ref 11.5–15.5)
WBC: 9 10*3/uL (ref 4.0–10.5)

## 2015-09-30 LAB — APTT: APTT: 32 s (ref 24–37)

## 2015-09-30 LAB — POC OCCULT BLOOD, ED: FECAL OCCULT BLD: POSITIVE — AB

## 2015-09-30 MED ORDER — ONDANSETRON HCL 4 MG/2ML IJ SOLN
4.0000 mg | Freq: Once | INTRAMUSCULAR | Status: AC
  Administered 2015-09-30: 4 mg via INTRAVENOUS
  Filled 2015-09-30: qty 2

## 2015-09-30 MED ORDER — HYDROMORPHONE HCL 1 MG/ML IJ SOLN
0.5000 mg | Freq: Once | INTRAMUSCULAR | Status: AC
  Administered 2015-09-30: 0.5 mg via INTRAVENOUS
  Filled 2015-09-30: qty 1

## 2015-09-30 NOTE — ED Notes (Signed)
Patient complaining of abdominal pain and nausea at this time. States he was having abdominal pain associated with a bowel movement at approximately 1500 today. States he had bowel movement and as he was standing back up felt pressure in his rectum. States he had uncontrollable bleeding from rectum at that time.

## 2015-09-30 NOTE — ED Provider Notes (Signed)
CSN: [id number]     Arrival date & time 09/30/15  1804 History   First MD Initiated Contact with Patient 09/30/15 1859     Chief Complaint  Patient presents with  . Abdominal Pain  . Rectal Bleeding     (Consider location/radiation/quality/duration/timing/severity/associated sxs/prior Treatment) HPI  The pt is otherwise healthy has no past prior surgical history, no medical history, does not use aspirin or other anticoagulants. He states that approximately 2 weeks ago he was seen in the emergency department, diagnosed with prostatitis by CT scan after having some lower abdominal pain. He has been on doxycycline since that time, improved until today when he was at work, had a bowel movement which was normal but immediately after the bowel movement felt like he had to have a very urgent bowel movement, he had a small amount of dark red blood that came out of his rectum. There was no pain with this and the rectum however he has had abdominal cramping since that time in the suprapubic and left lower quadrant. No fevers chills nausea vomiting or other complaints.  History reviewed. No pertinent past medical history. Past Surgical History  Procedure Laterality Date  . Cyst removal pediatric     No family history on file. Social History  Substance Use Topics  . Smoking status: Current Every Day Smoker -- 1.00 packs/day    Types: Cigarettes  . Smokeless tobacco: None  . Alcohol Use: Yes    Review of Systems  All other systems reviewed and are negative.     Allergies  Pineapple and Coconut flavor  Home Medications   Prior to Admission medications   Medication Sig Start Date End Date Taking? Authorizing Provider  HYDROcodone-acetaminophen (NORCO/VICODIN) 5-325 MG tablet Take 1-2 tablets by mouth every 6 (six) hours as needed. 09/16/15  Yes Vanetta Mulders, MD  doxycycline (VIBRAMYCIN) 100 MG capsule Take 1 capsule (100 mg total) by mouth 2 (two) times daily. Patient not taking:  Reported on 09/30/2015 09/16/15   Vanetta Mulders, MD   BP 119/70 mmHg  Pulse 68  Temp(Src) 98.3 F (36.8 C) (Oral)  Resp 20  Ht  (1.803 m)  Wt 155 lb (70.308 kg)  BMI 21.63 kg/m2  SpO2 100% Physical Exam  Constitutional: He appears well-developed and well-nourished. No distress.  HENT:  Head: Normocephalic and atraumatic.  Mouth/Throat: Oropharynx is clear and moist. No oropharyngeal exudate.  Eyes: Conjunctivae and EOM are normal. Pupils are equal, round, and reactive to light. Right eye exhibits no discharge. Left eye exhibits no discharge. No scleral icterus.  Neck: Normal range of motion. Neck supple. No JVD present. No thyromegaly present.  Cardiovascular: Normal rate, regular rhythm, normal heart sounds and intact distal pulses.  Exam reveals no gallop and no friction rub.   No murmur heard. Pulmonary/Chest: Effort normal and breath sounds normal. No respiratory distress. He has no wheezes. He has no rales.  Abdominal: Soft. Bowel sounds are normal. He exhibits no distension and no mass. There is tenderness.  Mild tenderness to palpation in the suprapubic and left lower quadrant.  Genitourinary:  Normal-appearing anus, no fissures, no hemorrhoids, masses, no abnormal findings, normal internal exam without masses, hemorrhoids, tenderness. Prostate is slightly enlarged and feels slightly boggy and is tender  Musculoskeletal: Normal range of motion. He exhibits no edema or tenderness.  Lymphadenopathy:    He has no cervical adenopathy.  Neurological: He is alert. Coordination normal.  Skin: Skin is warm and dry. No rash noted. No erythema.  Psychiatric: He has a normal mood and affect. His behavior is normal.  Nursing note and vitals reviewed.   ED Course  Procedures (including critical care time) Labs Review Labs Reviewed  POC OCCULT BLOOD, ED - Abnormal; Notable for the following:    Fecal Occult Bld POSITIVE (*)    All other components within normal limits   COMPREHENSIVE METABOLIC PANEL  CBC  PROTIME-INR  APTT    Imaging Review No results found. I have personally reviewed and evaluated these images and lab results as part of my medical decision-making.    MDM   Final diagnoses:  Rectal bleeding    The vital signs are unremarkable, his abdominal exam is consistent with potentially having ongoing GI bleeding. Prostatitis does not give a very good explanation for GI bleeding, would suspect diverticulosis given no immediately palpable findings in the rectum, also given the color of the blood being slightly dark would suggest a higher source such as diverticulosis. We'll check his blood CBC, BMP, likely needs a gastrointestinal referral. He does not appear unstable at all. Hgb normal - no further bleeding in ED - given GI referral - recommended motrin / tylenol for pain - aware of indications for returh.  Meds given in ED:  Medications  HYDROmorphone (DILAUDID) injection 0.5 mg (0.5 mg Intravenous Given 09/30/15 2011)  ondansetron (ZOFRAN) injection 4 mg (4 mg Intravenous Given 09/30/15 2011)        Miguel HongBrian Hutson Luft, MD 09/30/15 2042

## 2015-09-30 NOTE — ED Notes (Signed)
Patient reports of waking up with lower abdominal pain. States he had 1 episode of bright red blood in stool. States he was dx with prostate infection 2 weeks ago. Denies nausea/vomiting.

## 2015-09-30 NOTE — Discharge Instructions (Signed)

## 2015-10-02 ENCOUNTER — Encounter (HOSPITAL_COMMUNITY): Payer: Self-pay | Admitting: Emergency Medicine

## 2015-10-02 ENCOUNTER — Emergency Department (HOSPITAL_COMMUNITY)
Admission: EM | Admit: 2015-10-02 | Discharge: 2015-10-02 | Disposition: A | Discharge status: Home / Self Care | Payer: BLUE CROSS/BLUE SHIELD | Attending: Emergency Medicine | Admitting: Emergency Medicine

## 2015-10-02 ENCOUNTER — Emergency Department (HOSPITAL_COMMUNITY): Payer: BLUE CROSS/BLUE SHIELD

## 2015-10-02 DIAGNOSIS — Z720 Tobacco use: Secondary | ICD-10-CM | POA: Diagnosis not present

## 2015-10-02 DIAGNOSIS — K5900 Constipation, unspecified: Secondary | ICD-10-CM | POA: Diagnosis not present

## 2015-10-02 DIAGNOSIS — R110 Nausea: Secondary | ICD-10-CM | POA: Diagnosis not present

## 2015-10-02 DIAGNOSIS — K921 Melena: Secondary | ICD-10-CM | POA: Insufficient documentation

## 2015-10-02 DIAGNOSIS — Z87438 Personal history of other diseases of male genital organs: Secondary | ICD-10-CM | POA: Insufficient documentation

## 2015-10-02 DIAGNOSIS — R1032 Left lower quadrant pain: Secondary | ICD-10-CM | POA: Insufficient documentation

## 2015-10-02 DIAGNOSIS — R109 Unspecified abdominal pain: Secondary | ICD-10-CM

## 2015-10-02 LAB — URINALYSIS, ROUTINE W REFLEX MICROSCOPIC
Bilirubin Urine: NEGATIVE
GLUCOSE, UA: NEGATIVE mg/dL
Hgb urine dipstick: NEGATIVE
KETONES UR: NEGATIVE mg/dL
LEUKOCYTES UA: NEGATIVE
NITRITE: NEGATIVE
PH: 6.5 (ref 5.0–8.0)
Protein, ur: NEGATIVE mg/dL
SPECIFIC GRAVITY, URINE: 1.02 (ref 1.005–1.030)
Urobilinogen, UA: 0.2 mg/dL (ref 0.0–1.0)

## 2015-10-02 LAB — CBC
HEMATOCRIT: 41.7 % (ref 39.0–52.0)
Hemoglobin: 14.3 g/dL (ref 13.0–17.0)
MCH: 30.1 pg (ref 26.0–34.0)
MCHC: 34.3 g/dL (ref 30.0–36.0)
MCV: 87.8 fL (ref 78.0–100.0)
PLATELETS: 183 10*3/uL (ref 150–400)
RBC: 4.75 MIL/uL (ref 4.22–5.81)
RDW: 13 % (ref 11.5–15.5)
WBC: 4.7 10*3/uL (ref 4.0–10.5)

## 2015-10-02 LAB — COMPREHENSIVE METABOLIC PANEL
ALT: 22 U/L (ref 17–63)
AST: 24 U/L (ref 15–41)
Albumin: 4.2 g/dL (ref 3.5–5.0)
Alkaline Phosphatase: 45 U/L (ref 38–126)
Anion gap: 5 (ref 5–15)
BUN: 16 mg/dL (ref 6–20)
CHLORIDE: 106 mmol/L (ref 101–111)
CO2: 27 mmol/L (ref 22–32)
CREATININE: 0.86 mg/dL (ref 0.61–1.24)
Calcium: 9.2 mg/dL (ref 8.9–10.3)
GFR calc non Af Amer: >60 mL/min (ref >60)
Glucose, Bld: 92 mg/dL (ref 65–99)
POTASSIUM: 4.1 mmol/L (ref 3.5–5.1)
SODIUM: 138 mmol/L (ref 135–145)
Total Bilirubin: 0.8 mg/dL (ref 0.3–1.2)
Total Protein: 7.1 g/dL (ref 6.5–8.1)

## 2015-10-02 LAB — POC OCCULT BLOOD, ED: Fecal Occult Bld: NEGATIVE

## 2015-10-02 LAB — LIPASE, BLOOD: LIPASE: 26 U/L (ref 11–51)

## 2015-10-02 MED ORDER — POLYETHYLENE GLYCOL 3350 17 G PO PACK: 17.0000 g | PACK | Freq: Every day | ORAL | Status: DC

## 2015-10-02 NOTE — Discharge Instructions (Signed)

## 2015-10-02 NOTE — ED Notes (Signed)
Pt states he has been seen several times for blood in stools and abdominal pain. States he woke up this morning feeling like he had to use the bathroom but could not "go", states it "hurts to push"

## 2015-10-02 NOTE — ED Provider Notes (Signed)
CSN: [id number]     Arrival date & time 10/02/15  0630 History   First MD Initiated Contact with Patient 10/02/15 [phone]     Chief Complaint  Patient presents with  . Constipation  . Abdominal Cramping     (Consider location/radiation/quality/duration/timing/severity/associated sxs/prior Treatment) HPI Comments: Patient complains of lower abdominal cramping and inability to have a bowel movement. He was seen in the ED 2 days ago after an episode of rectal bleeding. He states he's not had any bowel movements since then. No other rectal bleeding episodes. He's had abdominal cramping since that time and a suprapubic left lower quadrants. No fever, chills, nausea or vomiting. He was treated for prostatitis October 19 diagnosed by CT scan. He finished doxycycline course. No previous diagnosis of IBD or IBS. No other medical problems. States he strained the toilet for about 45 minutes this morning he could not go see came to the emergency department. Denies any additional bright red blood per rectum.  Patient is a 24 y.o. male presenting with cramps. The history is provided by the patient.  Abdominal Cramping Associated symptoms include abdominal pain. Pertinent negatives include no chest pain, no headaches and no shortness of breath.    History reviewed. No pertinent past medical history. Past Surgical History  Procedure Laterality Date  . Cyst removal pediatric     History reviewed. No pertinent family history. Social History  Substance Use Topics  . Smoking status: Current Every Day Smoker -- 1.00 packs/day    Types: Cigarettes  . Smokeless tobacco: None  . Alcohol Use: Yes    Review of Systems  Constitutional: Negative for fever, activity change and appetite change.  HENT: Negative for congestion.   Respiratory: Negative for cough, chest tightness and shortness of breath.   Cardiovascular: Negative for chest pain.  Gastrointestinal: Positive for nausea, abdominal pain, constipation  and blood in stool. Negative for vomiting and anal bleeding.  Genitourinary: Negative for dysuria, urgency, hematuria and testicular pain.  Musculoskeletal: Negative for myalgias and arthralgias.  Skin: Negative for wound.  Neurological: Negative for dizziness, weakness and headaches.  A complete 10 system review of systems was obtained and all systems are negative except as noted in the HPI and PMH.      Allergies  Pineapple and Coconut flavor  Home Medications   Prior to Admission medications   Medication Sig Start Date End Date Taking? Authorizing Provider  doxycycline (VIBRAMYCIN) 100 MG capsule Take 1 capsule (100 mg total) by mouth 2 (two) times daily. Patient not taking: Reported on 09/30/2015 09/16/15   Vanetta MuldersScott Zackowski, MD  HYDROcodone-acetaminophen (NORCO/VICODIN) 5-325 MG tablet Take 1-2 tablets by mouth every 6 (six) hours as needed. 09/16/15   Vanetta MuldersScott Zackowski, MD  polyethylene glycol [facility](MIRALAX) packet Take 17 g by mouth daily. 10/02/15   Glynn OctaveStephen Jese Comella, MD   BP 127/71 mmHg  Pulse 57  Temp(Src) 98 F (36.7 C) (Oral)  Resp 15  Ht 5\' 11"  (1.803 m)  Wt 155 lb (70.308 kg)  BMI 21.63 kg/m2  SpO2 98% Physical Exam  Constitutional: He is oriented to person, place, and time. He appears well-developed and well-nourished. No distress.  HENT:  Head: Normocephalic and atraumatic.  Mouth/Throat: Oropharynx is clear and moist. No oropharyngeal exudate.  Eyes: Conjunctivae and EOM are normal. Pupils are equal, round, and reactive to light.  Neck: Normal range of motion. Neck supple.  No meningismus.  Cardiovascular: Normal rate, regular rhythm, normal heart sounds and intact distal pulses.   No murmur heard.  Pulmonary/Chest: Effort normal and breath sounds normal. No respiratory distress.  Abdominal: Soft. There is tenderness. There is no rebound and no guarding.  Suprapubic and left lower quadrant tenderness, no guarding or rebound  Genitourinary:  No external hemorrhoids or  fissures. No gross blood on examining finger  Musculoskeletal: Normal range of motion. He exhibits no edema or tenderness.  Neurological: He is alert and oriented to person, place, and time. No cranial nerve deficit. He exhibits normal muscle tone. Coordination normal.  No ataxia on finger to nose bilaterally. No pronator drift. 5/5 strength throughout. CN 2-12 intact. Negative Romberg. Equal grip strength. Sensation intact. Gait is normal.   Skin: Skin is warm.  Psychiatric: He has a normal mood and affect. His behavior is normal.  Nursing note and vitals reviewed.   ED Course  Procedures (including critical care time) Labs Review Labs Reviewed  LIPASE, BLOOD  COMPREHENSIVE METABOLIC PANEL  CBC  URINALYSIS, ROUTINE W REFLEX MICROSCOPIC (NOT AT [facility])  POC OCCULT BLOOD, ED    Imaging Review Dg Abd Acute W/chest  10/02/2015  CLINICAL DATA:  Low abdominal pain for couple of days EXAM: DG ABDOMEN ACUTE W/ 1V CHEST COMPARISON:  08/18/2015 FINDINGS: Lungs are hyperaerated and clear. No pneumothorax. Normal heart size. No pleural effusion. No free intraperitoneal gas. No disproportionate dilatation of bowel. No air-fluid levels. Moderate stool burden in the ascending colon. IMPRESSION: No active cardiopulmonary disease. Nonobstructive bowel gas pattern. No free intraperitoneal gas. Electronically Signed   By: Jolaine Click M.D.   On: 10/02/2015 08:14   I have personally reviewed and evaluated these images and lab results as part of my medical decision-making.   EKG Interpretation None      MDM   Final diagnoses:  Abdominal pain, unspecified abdominal location    Lower abdominal pain with feeling of needing to have a bowel movement since this morning. Seen 2 days ago after episode of rectal bleeding. No further rectal bleeding. Thought to be diverticulosis on previous ED visit.  Abdomen soft without peritoneal signs. CT scan from October 19 reviewed.  hemoglobin is stable.  Orthostatics are negative. Hemoccult is negative.  Abdomen soft without peritoneal signs. Patient already has been instructed to follow up with GI. We'll start Maalox for possible constipation. Return precautions discussed.  BP 127/71 mmHg  Pulse 57  Temp(Src) 98 F (36.7 C) (Oral)  Resp 15  Ht  (1.803 m)  Wt 155 lb (70.308 kg)  BMI 21.63 kg/m2  SpO2 98%    Glynn Octave, MD 10/02/15 1749

## 2015-10-02 NOTE — ED Notes (Signed)
Patient with no complaints at this time. Respirations even and unlabored. Skin warm/dry. Discharge instructions reviewed with patient at this time. Patient given opportunity to voice concerns/ask questions. Patient discharged at this time and left Emergency Department with steady gait.   

## 2016-05-23 ENCOUNTER — Encounter (HOSPITAL_COMMUNITY): Payer: Self-pay | Admitting: Emergency Medicine

## 2016-05-23 ENCOUNTER — Emergency Department (HOSPITAL_COMMUNITY)
Admission: EM | Admit: 2016-05-23 | Discharge: 2016-05-23 | Disposition: A | Discharge status: Home / Self Care | Payer: BLUE CROSS/BLUE SHIELD | Attending: Emergency Medicine | Admitting: Emergency Medicine

## 2016-05-23 DIAGNOSIS — F17210 Nicotine dependence, cigarettes, uncomplicated: Secondary | ICD-10-CM | POA: Insufficient documentation

## 2016-05-23 DIAGNOSIS — R1084 Generalized abdominal pain: Secondary | ICD-10-CM | POA: Diagnosis not present

## 2016-05-23 DIAGNOSIS — R112 Nausea with vomiting, unspecified: Secondary | ICD-10-CM | POA: Insufficient documentation

## 2016-05-23 DIAGNOSIS — M2550 Pain in unspecified joint: Secondary | ICD-10-CM | POA: Insufficient documentation

## 2016-05-23 DIAGNOSIS — R197 Diarrhea, unspecified: Secondary | ICD-10-CM | POA: Insufficient documentation

## 2016-05-23 LAB — COMPREHENSIVE METABOLIC PANEL
ALBUMIN: 4.5 g/dL (ref 3.5–5.0)
ALT: 22 U/L (ref 17–63)
ANION GAP: 7 (ref 5–15)
AST: 32 U/L (ref 15–41)
Alkaline Phosphatase: 54 U/L (ref 38–126)
BUN: 15 mg/dL (ref 6–20)
CALCIUM: 9 mg/dL (ref 8.9–10.3)
CO2: 26 mmol/L (ref 22–32)
Chloride: 104 mmol/L (ref 101–111)
Creatinine, Ser: 0.99 mg/dL (ref 0.61–1.24)
GFR calc non Af Amer: >60 mL/min (ref >60)
GLUCOSE: 86 mg/dL (ref 65–99)
POTASSIUM: 3.9 mmol/L (ref 3.5–5.1)
SODIUM: 137 mmol/L (ref 135–145)
TOTAL PROTEIN: 7.1 g/dL (ref 6.5–8.1)
Total Bilirubin: 1.7 mg/dL — ABNORMAL HIGH (ref 0.3–1.2)

## 2016-05-23 LAB — URINALYSIS, ROUTINE W REFLEX MICROSCOPIC
Glucose, UA: NEGATIVE mg/dL
Hgb urine dipstick: NEGATIVE
Ketones, ur: 15 mg/dL — AB
Leukocytes, UA: NEGATIVE
NITRITE: NEGATIVE
PH: 6.5 (ref 5.0–8.0)
Protein, ur: NEGATIVE mg/dL
SPECIFIC GRAVITY, URINE: 1.02 (ref 1.005–1.030)

## 2016-05-23 LAB — CBC
HEMATOCRIT: 42.4 % (ref 39.0–52.0)
HEMOGLOBIN: 14.7 g/dL (ref 13.0–17.0)
MCH: 30.2 pg (ref 26.0–34.0)
MCHC: 34.7 g/dL (ref 30.0–36.0)
MCV: 87.1 fL (ref 78.0–100.0)
Platelets: 187 10*3/uL (ref 150–400)
RBC: 4.87 MIL/uL (ref 4.22–5.81)
RDW: 13.1 % (ref 11.5–15.5)
WBC: 5.8 10*3/uL (ref 4.0–10.5)

## 2016-05-23 LAB — LIPASE, BLOOD: Lipase: 19 U/L (ref 11–51)

## 2016-05-23 MED ORDER — GI COCKTAIL ~~LOC~~
30.0000 mL | Freq: Once | ORAL | Status: AC
  Administered 2016-05-23: 30 mL via ORAL
  Filled 2016-05-23: qty 30

## 2016-05-23 MED ORDER — SODIUM CHLORIDE 0.9 % IV BOLUS (SEPSIS)
1000.0000 mL | Freq: Once | INTRAVENOUS | Status: AC
  Administered 2016-05-23: 1000 mL via INTRAVENOUS
  Filled 2016-05-23: qty 1000

## 2016-05-23 MED ORDER — DOXYCYCLINE HYCLATE 100 MG PO CAPS: 100.0000 mg | ORAL_CAPSULE | Freq: Two times a day (BID) | ORAL | Status: DC

## 2016-05-23 MED ORDER — ONDANSETRON HCL 4 MG/2ML IJ SOLN
4.0000 mg | Freq: Once | INTRAMUSCULAR | Status: AC
  Administered 2016-05-23: 4 mg via INTRAVENOUS
  Filled 2016-05-23: qty 2

## 2016-05-23 NOTE — ED Notes (Signed)
Patient given discharge instruction, verbalized understand. IV removed, band aid applied. Patient ambulatory out of the department.  

## 2016-05-23 NOTE — ED Provider Notes (Signed)
CSN: [id number]     Arrival date & time 05/23/16  0827 History  By signing my name below, I, Phillis HaggisGabriella Gaje, attest that this documentation has been prepared under the direction and in the presence of Marily MemosJason Duc Crocket, MD. Electronically Signed: Phillis HaggisGabriella Gaje, ED Scribe. 05/23/2016. 8:56 AM.  Chief Complaint  Patient presents with  . Abdominal Pain  . Emesis   The history is provided by the patient. No language interpreter was used.  HPI Comments: Miguel Spears is a 25 y.o. male who presents to the Emergency Department complaining of gradually worsening, generalized abdominal pain onset one day ago. Pt reports associated diaphoresis, bilarteral arm pain, cough, nausea, vomiting, and diarrhea. Pt states that he did not feel that great yesterday and was bitten by a tick underneath his right arm. He was able to remove the head from the skin. He reports worsening pa Pt is a smoker and drinks alcohol. He denies hx of symptoms this severe. He denies sick contacts, hematochezia, hematemesis, testicular pain, penile pain, or rashes.    History reviewed. No pertinent past medical history. Past Surgical History  Procedure Laterality Date  . Cyst removal pediatric     History reviewed. No pertinent family history. Social History  Substance Use Topics  . Smoking status: Current Every Day Smoker -- 1.00 packs/day    Types: Cigarettes  . Smokeless tobacco: None  . Alcohol Use: No     Comment: occasionally    Review of Systems  Constitutional: Positive for diaphoresis.  Gastrointestinal: Positive for nausea, vomiting, abdominal pain and diarrhea. Negative for blood in stool.  Genitourinary: Negative for penile pain and testicular pain.  Musculoskeletal: Positive for arthralgias.  Skin: Negative for rash.  All other systems reviewed and are negative.  Allergies  Pineapple and Coconut flavor  Home Medications   Prior to Admission medications   Medication Sig Start Date End Date Taking?  Authorizing Provider  doxycycline (VIBRAMYCIN) 100 MG capsule Take 1 capsule (100 mg total) by mouth 2 (two) times daily. One po bid x 7 days 05/25/16   Marily MemosJason Guiselle Mian, MD   BP 123/60 mmHg  Pulse 56  Temp(Src) 97.6 F (36.4 C) (Oral)  Resp 18  Ht 5\' 11"  (1.803 m)  Wt 150 lb (68.04 kg)  BMI 20.93 kg/m2  SpO2 98% Physical Exam  Constitutional: He is oriented to person, place, and time. He appears well-developed and well-nourished.  HENT:  Head: Normocephalic and atraumatic.  Eyes: EOM are normal. Pupils are equal, round, and reactive to light.  Neck: Normal range of motion. Neck supple.  Cardiovascular: Normal rate, regular rhythm, normal heart sounds and intact distal pulses.   Pulmonary/Chest: Effort normal. He has wheezes.  Mild generalized expiratory wheezing  Abdominal: Soft. There is no tenderness.  Musculoskeletal: Normal range of motion. He exhibits no tenderness.  Neurological: He is alert and oriented to person, place, and time. He has normal reflexes.  Skin: Skin is warm and dry. No rash noted.  Small erythematous area underneath right axilla without fluctuance or induration  Psychiatric: He has a normal mood and affect. His behavior is normal.  Nursing note and vitals reviewed.   ED Course  Procedures (including critical care time) DIAGNOSTIC STUDIES: Oxygen Saturation is 98% on RA, normal by my interpretation.    COORDINATION OF CARE: 8:55 AM-Discussed treatment plan which includes labs with pt at bedside and pt agreed to plan.   Labs Review Labs Reviewed  COMPREHENSIVE METABOLIC PANEL - Abnormal; Notable for the following:  Total Bilirubin 1.7 (*)    All other components within normal limits  URINALYSIS, ROUTINE W REFLEX MICROSCOPIC (NOT [facility]) - Abnormal; Notable for the following:    Bilirubin Urine SMALL (*)    Ketones, ur 15 (*)    All other components within normal limits  LIPASE, BLOOD  CBC    Imaging Review No results found. I have personally  reviewed and evaluated these lab results as part of my medical decision-making.   EKG Interpretation None      MDM   Final diagnoses:  Non-intractable vomiting with nausea, vomiting of unspecified type    N/v/d Likely secondary to gastroenteritis. However did have a tick stuck for an unknown period time and some mild myalgias. We'll give her prescription for doxycycline if the symptoms are not improving 24 hours he will get it filled and started. Otherwise will follow up with primary doctor as needed or or here if symptoms worsen.  New Prescriptions: New Prescriptions   DOXYCYCLINE (VIBRAMYCIN) 100 MG CAPSULE    Take 1 capsule (100 mg total) by mouth 2 (two) times daily. One po bid x 7 days     I have personally and contemperaneously reviewed labs and imaging and used in my decision making as above.   A medical screening exam was performed and I feel the patient has had an appropriate workup for their chief complaint at this time and likelihood of emergent condition existing is low and thus workup can continue on an outpatient basis.. Their vital signs are stable. They have been counseled on decision, discharge, follow up and which symptoms necessitate immediate return to the emergency department.  They verbally stated understanding and agreement with plan and discharged in stable condition.   I personally performed the services described in this documentation, which was scribed in my presence. The recorded information has been reviewed and is accurate.     Marily MemosJason Kynsli Haapala, MD 05/23/16 1220

## 2016-05-23 NOTE — ED Notes (Signed)
Patient complaining of abdominal pain and vomiting starting this morning.  

## 2017-05-24 IMAGING — DX DG CHEST 2V
2 series · 2 of 2 positions shown · non-contrast
Comparison: None.

CLINICAL DATA: Chest pain.  Hand numbness.

EXAM:
CHEST  2 VIEW

[chest lat]
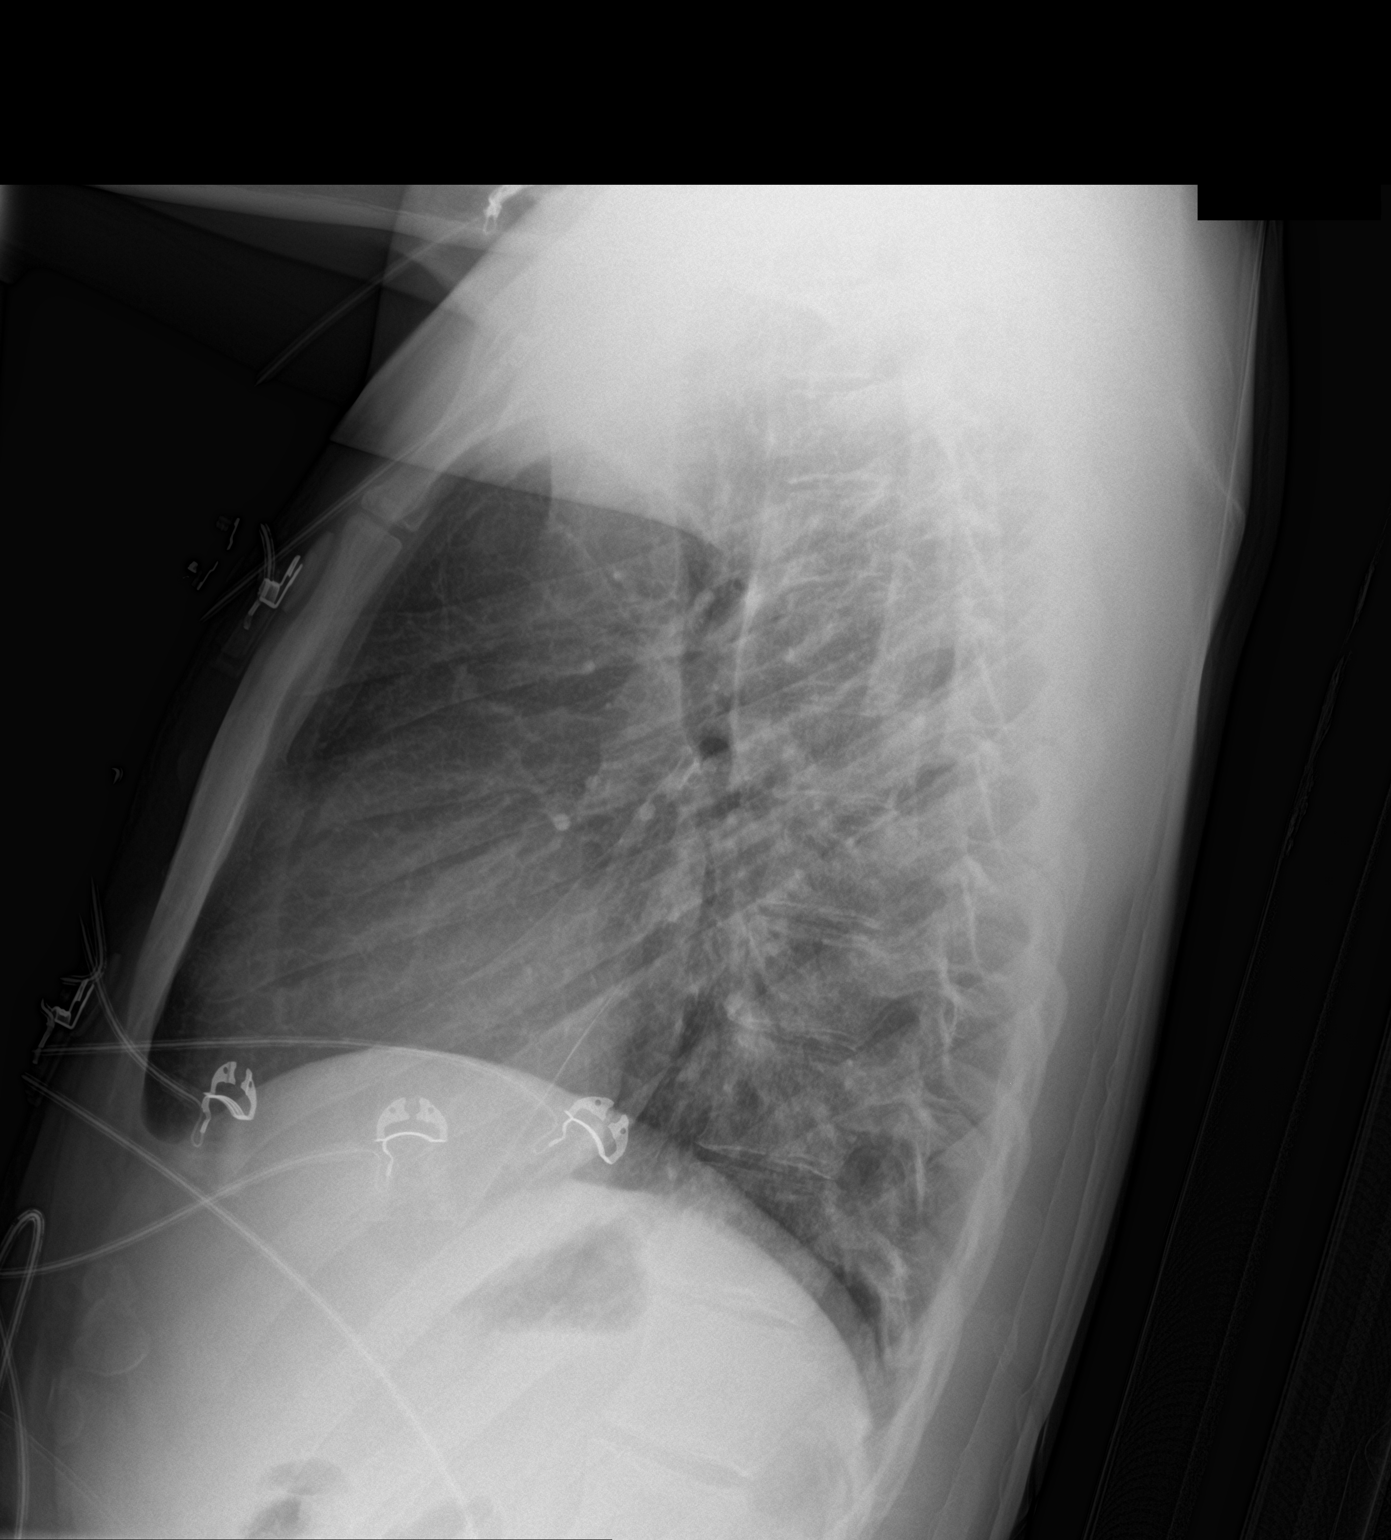

[chest ap]
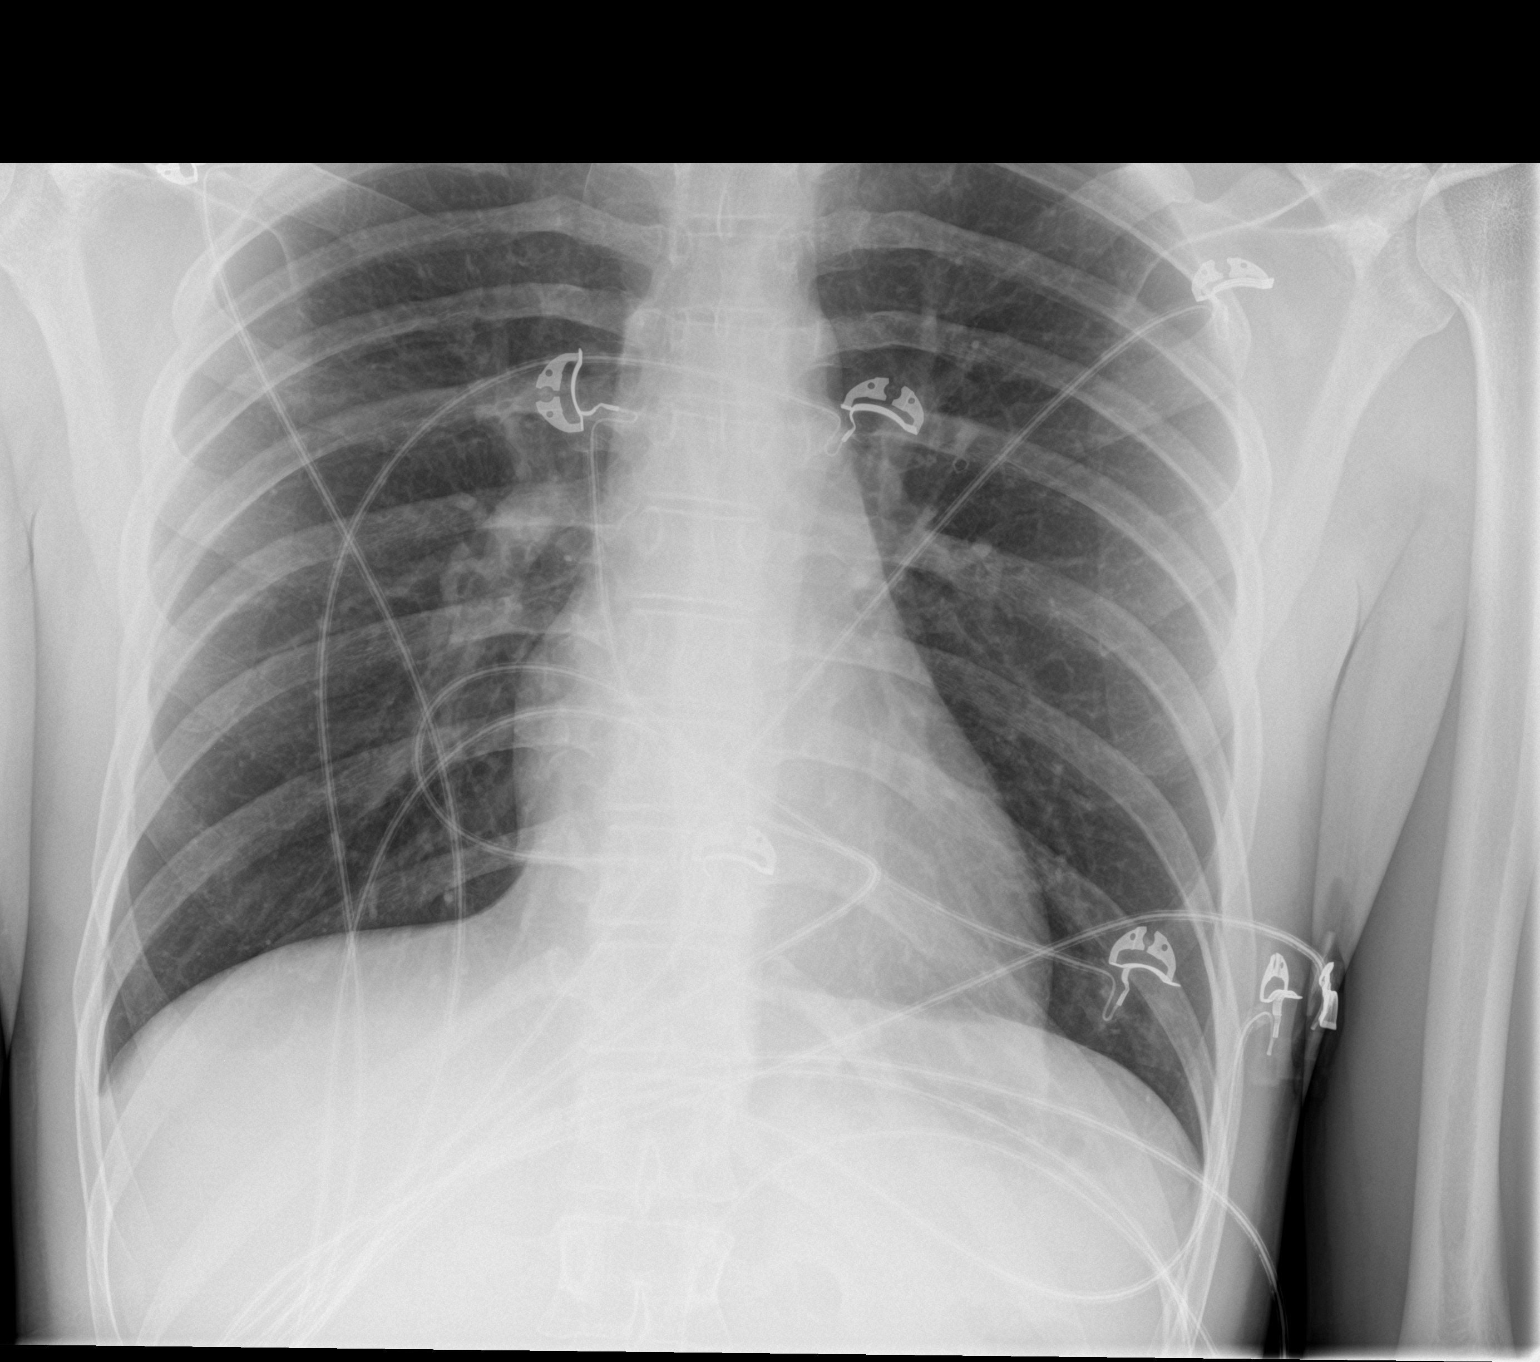

[2 of 2 positions shown; findings below may reference images not displayed]

FINDINGS: The cardiomediastinal contours are normal. The lungs are clear.
Pulmonary vasculature is normal. No consolidation, pleural effusion,
or pneumothorax. No acute osseous abnormalities are seen.
IMPRESSION: No acute pulmonary process.

## 2017-06-21 ENCOUNTER — Emergency Department (HOSPITAL_COMMUNITY): Payer: BLUE CROSS/BLUE SHIELD

## 2017-06-21 ENCOUNTER — Emergency Department (HOSPITAL_COMMUNITY)
Admission: EM | Admit: 2017-06-21 | Discharge: 2017-06-21 | Disposition: A | Discharge status: Home / Self Care | Payer: BLUE CROSS/BLUE SHIELD | Attending: Emergency Medicine | Admitting: Emergency Medicine

## 2017-06-21 ENCOUNTER — Encounter (HOSPITAL_COMMUNITY): Payer: Self-pay | Admitting: Emergency Medicine

## 2017-06-21 DIAGNOSIS — Y9389 Activity, other specified: Secondary | ICD-10-CM | POA: Diagnosis not present

## 2017-06-21 DIAGNOSIS — Y9289 Other specified places as the place of occurrence of the external cause: Secondary | ICD-10-CM | POA: Insufficient documentation

## 2017-06-21 DIAGNOSIS — T675XXA Heat exhaustion, unspecified, initial encounter: Secondary | ICD-10-CM | POA: Diagnosis not present

## 2017-06-21 DIAGNOSIS — Y990 Civilian activity done for income or pay: Secondary | ICD-10-CM | POA: Insufficient documentation

## 2017-06-21 DIAGNOSIS — K2900 Acute gastritis without bleeding: Secondary | ICD-10-CM | POA: Insufficient documentation

## 2017-06-21 DIAGNOSIS — X32XXXA Exposure to sunlight, initial encounter: Secondary | ICD-10-CM | POA: Insufficient documentation

## 2017-06-21 DIAGNOSIS — R1013 Epigastric pain: Secondary | ICD-10-CM | POA: Diagnosis present

## 2017-06-21 LAB — LIPASE, BLOOD: LIPASE: 23 U/L (ref 11–51)

## 2017-06-21 LAB — CBC WITH DIFFERENTIAL/PLATELET
BASOS ABS: 0 10*3/uL (ref 0.0–0.1)
BASOS PCT: 0 %
EOS ABS: 0.1 10*3/uL (ref 0.0–0.7)
EOS PCT: 2 %
HCT: 40.5 % (ref 39.0–52.0)
Hemoglobin: 14.1 g/dL (ref 13.0–17.0)
Lymphocytes Relative: 43 %
Lymphs Abs: 3 10*3/uL (ref 0.7–4.0)
MCH: 31.1 pg (ref 26.0–34.0)
MCHC: 34.8 g/dL (ref 30.0–36.0)
MCV: 89.2 fL (ref 78.0–100.0)
MONO ABS: 0.8 10*3/uL (ref 0.1–1.0)
MONOS PCT: 12 %
Neutro Abs: 3.1 10*3/uL (ref 1.7–7.7)
Neutrophils Relative %: 43 %
PLATELETS: 198 10*3/uL (ref 150–400)
RBC: 4.54 MIL/uL (ref 4.22–5.81)
RDW: 13 % (ref 11.5–15.5)
WBC: 7 10*3/uL (ref 4.0–10.5)

## 2017-06-21 LAB — COMPREHENSIVE METABOLIC PANEL
ALBUMIN: 4.3 g/dL (ref 3.5–5.0)
ALK PHOS: 45 U/L (ref 38–126)
ALT: 21 U/L (ref 17–63)
ANION GAP: 8 (ref 5–15)
AST: 24 U/L (ref 15–41)
BILIRUBIN TOTAL: 0.6 mg/dL (ref 0.3–1.2)
BUN: 12 mg/dL (ref 6–20)
CALCIUM: 9.3 mg/dL (ref 8.9–10.3)
CO2: 24 mmol/L (ref 22–32)
CREATININE: 0.85 mg/dL (ref 0.61–1.24)
Chloride: 102 mmol/L (ref 101–111)
GFR calc Af Amer: >60 mL/min (ref >60)
GFR calc non Af Amer: >60 mL/min (ref >60)
GLUCOSE: 87 mg/dL (ref 65–99)
Potassium: 3.8 mmol/L (ref 3.5–5.1)
Sodium: 134 mmol/L — ABNORMAL LOW (ref 135–145)
TOTAL PROTEIN: 7.1 g/dL (ref 6.5–8.1)

## 2017-06-21 LAB — TROPONIN I

## 2017-06-21 LAB — CK: Total CK: 279 U/L (ref 49–397)

## 2017-06-21 MED ORDER — DICYCLOMINE HCL 10 MG/ML IM SOLN
20.0000 mg | Freq: Once | INTRAMUSCULAR | Status: AC
  Administered 2017-06-21: 20 mg via INTRAMUSCULAR
  Filled 2017-06-21: qty 2

## 2017-06-21 MED ORDER — ONDANSETRON HCL 4 MG/2ML IJ SOLN
4.0000 mg | Freq: Once | INTRAMUSCULAR | Status: AC
  Administered 2017-06-21: 4 mg via INTRAVENOUS
  Filled 2017-06-21: qty 2

## 2017-06-21 MED ORDER — OMEPRAZOLE 20 MG PO CPDR: 20.0000 mg | DELAYED_RELEASE_CAPSULE | Freq: Two times a day (BID) | ORAL | 1 refills | Status: DC

## 2017-06-21 MED ORDER — SODIUM CHLORIDE 0.9 % IV BOLUS (SEPSIS)
2000.0000 mL | Freq: Once | INTRAVENOUS | Status: AC
  Administered 2017-06-21: 2000 mL via INTRAVENOUS

## 2017-06-21 MED ORDER — MORPHINE SULFATE (PF) 4 MG/ML IV SOLN
4.0000 mg | INTRAVENOUS | Status: DC | PRN
  Administered 2017-06-21: 4 mg via INTRAVENOUS
  Filled 2017-06-21: qty 1

## 2017-06-21 NOTE — ED Triage Notes (Signed)
Pt reports cramping type chest/epigastric pain starting suddenly while at work around lunch time.  Vomiting x2.  Non radiating pain.

## 2017-06-21 NOTE — ED Notes (Signed)
X-ray at bedside

## 2017-06-21 NOTE — Discharge Instructions (Signed)
Rest.  Avoid heat and humidity. Push fluids to stay hydrated.  No alcohol, tobacco, caffeine, or anti-inflammatory medicines.  Prilosec prescription for gastritis.

## 2017-06-21 NOTE — ED Provider Notes (Signed)
[facility]-EMERGENCY DEPT Provider Note   CSN: [id number] Arrival date & time: 06/21/17  1514     History   Chief Complaint Chief Complaint  Patient presents with  . Chest Pain    HPI Miguel Spears is a 26 y.o. male.Chief complaint is epigastric cramping and pain  HPI previously, an otherwise healthy 26 year old male. 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  He started developing some cramping below his epigastrium midline abdomen today about 2 hours ago. He states he has not urinated all day. He feels as though he drinks well. Vomited 2 which seemed to make his symptoms worse. No chest pain. States that it makes him feel like he is breathing hard, but has no pain with breathing. No recent illnesses. States he drank "a lot" on Saturday, 4 days ago. But no symptoms since. No cough. No fever. No diarrhea.  History reviewed. No pertinent past medical history.  There are no active problems to display for this patient.   Past Surgical History:  Procedure Laterality Date  . CYST REMOVAL PEDIATRIC         Home Medications    Prior to Admission medications   Medication Sig Start Date End Date Taking? Authorizing Provider  acetaminophen (TYLENOL) 325 MG tablet Take 975 mg by mouth every 6 (six) hours as needed for mild pain or moderate pain.   Yes [provider]  ibuprofen (ADVIL,MOTRIN) 200 MG tablet Take 600 mg by mouth every 6 (six) hours as needed.   Yes [provider]  omeprazole (PRILOSEC) 20 MG capsule Take 1 capsule (20 mg total) by mouth 2 (two) times daily. 06/21/17   Rolland PorterJames, Serenitee Fuertes, MD    Family History History reviewed. No pertinent family history.  Social History Social History  Substance Use Topics  . Smoking status: Never Smoker  . Smokeless tobacco: Never Used  . Alcohol use Yes     Comment: occasionally     Allergies   Pineapple and Coconut flavor   Review of Systems Review of Systems    Constitutional: Negative for appetite change, chills, diaphoresis, fatigue and fever.  HENT: Negative for mouth sores, sore throat and trouble swallowing.   Eyes: Negative for visual disturbance.  Respiratory: Negative for cough, chest tightness, shortness of breath and wheezing.   Cardiovascular: Negative for chest pain.  Gastrointestinal: Positive for abdominal pain, nausea and vomiting. Negative for abdominal distention and diarrhea.  Endocrine: Negative for polydipsia, polyphagia and polyuria.  Genitourinary: Negative for dysuria, frequency and hematuria.  Musculoskeletal: Positive for myalgias. Negative for gait problem.  Skin: Negative for color change, pallor and rash.  Neurological: Positive for dizziness. Negative for syncope, light-headedness and headaches.  Hematological: Does not bruise/bleed easily.  Psychiatric/Behavioral: Negative for behavioral problems and confusion.     Physical Exam Updated Vital Signs Pulse 82   Temp 98.4 F (36.9 C) (Oral)   Resp 18   Ht 5\' 11"  (1.803 m)   Wt 70.3 kg (155 lb)   SpO2 100%   BMI 21.62 kg/m   Physical Exam  Constitutional: He is oriented to person, place, and time. He appears well-developed and well-nourished. No distress.  Patient presents in heavy leather periods, pulse ox, and BDU/[employer] fatigue pants. It is 87 degrees and 50% humidity today.  HENT:  Head: Normocephalic.  Eyes: Pupils are equal, round, and reactive to light. Conjunctivae are normal. No scleral icterus.  Neck: Normal range of motion. Neck supple. No thyromegaly  present.  Cardiovascular: Normal rate and regular rhythm.  Exam reveals no gallop and no friction rub.   No murmur heard. Pulmonary/Chest: Effort normal and breath sounds normal. No respiratory distress. He has no wheezes. He has no rales.  Abdominal: Soft. Bowel sounds are normal. He exhibits no distension. There is no tenderness. There is no rebound.  Patient hold his fingers vertically below the  level of his xiphoid in his midline abdomen. No reproducible mass. Or tenderness.  Musculoskeletal: Normal range of motion.  Neurological: He is alert and oriented to person, place, and time.  Skin: Skin is warm and dry. No rash noted.  Psychiatric: He has a normal mood and affect. His behavior is normal.     ED Treatments / Results  Labs (all labs ordered are listed, but only abnormal results are displayed) Labs Reviewed  COMPREHENSIVE METABOLIC PANEL - Abnormal; Notable for the following:       Result Value   Sodium 134 (*)    All other components within normal limits  CBC WITH DIFFERENTIAL/PLATELET  CK  LIPASE, BLOOD  TROPONIN I    EKG  EKG Interpretation  Date/Time:  Wednesday June 21 2017 15:18:18 EDT Ventricular Rate:  73 PR Interval:  126 QRS Duration: 82 QT Interval:  362 QTC Calculation: 398 R Axis:   87 Text Interpretation:  Normal sinus rhythm Minimal voltage criteria for LVH, may be normal variant Early repolarization Borderline ECG Confirmed by Rolland PorterJames, Jahan Friedlander ([id number]) on 06/21/2017 3:35:13 PM       Radiology Dg Chest Port 1 View  Result Date: 06/21/2017 CLINICAL DATA:  Chest pain EXAM: PORTABLE CHEST 1 VIEW COMPARISON:  10/02/2015 FINDINGS: The heart size and mediastinal contours are within normal limits. Both lungs are clear. The visualized skeletal structures are unremarkable. IMPRESSION: No active disease. Electronically Signed   By: Marlan Palauharles  Clark M.D.   On: 06/21/2017 16:38    Procedures Procedures (including critical care time)  Medications Ordered in ED Medications  morphine 4 MG/ML injection 4 mg (4 mg Intravenous Given 06/21/17 1641)  ondansetron (ZOFRAN) injection 4 mg (4 mg Intravenous Given 06/21/17 1641)  dicyclomine (BENTYL) injection 20 mg (20 mg Intramuscular Given 06/21/17 1641)  sodium chloride 0.9 % bolus 2,000 mL (2,000 mLs Intravenous New Bag/Given 06/21/17 1641)     Initial Impression / Assessment and Plan / ED Course  I have reviewed  the triage vital signs and the nursing notes.  Pertinent labs & imaging results that were available during my care of the patient were reviewed by me and considered in my medical decision making (see chart for details).   recent heavy alcohol use, however with a several day symptom-free interval. Patient has heat exposure and intestinal cramping/heat exhaustion is a possibility. We'll check CPK, EKG shows no acute or ischemic changes. Plan fluids, symptom relief, lab evaluation, reevaluation.  Final Clinical Impressions(s) / ED Diagnoses   Final diagnoses:  Heat exhaustion, initial encounter  Acute gastritis without hemorrhage, unspecified gastritis type    Patient improved after treatment. Asymptomatic. He does state that he wakes up most mornings with an upset stomach and hasn't a.m. emesis about once per week. Does not drink regularly. Occasional caffeine. Occasional anti-inflammatories. Current nonsmoker. We'll add Prilosec. Encouraged primary care follow-up.  New Prescriptions New Prescriptions   OMEPRAZOLE (PRILOSEC) 20 MG CAPSULE    Take 1 capsule (20 mg total) by mouth 2 (two) times daily.     Rolland PorterJames, Adaline Trejos, MD 06/21/17 1816

## 2017-06-25 IMAGING — CT CT ABD-PELV W/ CM
2 of 3 series · 16 of 46 positions shown, 18 images · IV contrast (Omnipaque 300)
Comparison: MRI lumbar spine 01/09/2015

CLINICAL DATA: 24-year-old male with right-sided flank pain and
dysuria

EXAM:
CT ABDOMEN AND PELVIS WITH CONTRAST
TECHNIQUE: Multidetector CT imaging of the abdomen and pelvis was performed
using the standard protocol following bolus administration of
intravenous contrast.
CONTRAST:  100mL OMNIPAQUE IOHEXOL 300 MG/ML  SOLN

[Series 2: abd_pel_with 5.0 b40f · axial · 0.70mm/px · z∈[-452,-62]mm · 13 of 90 slices shown, 15 images]
[im 6/90  soft-tissue]
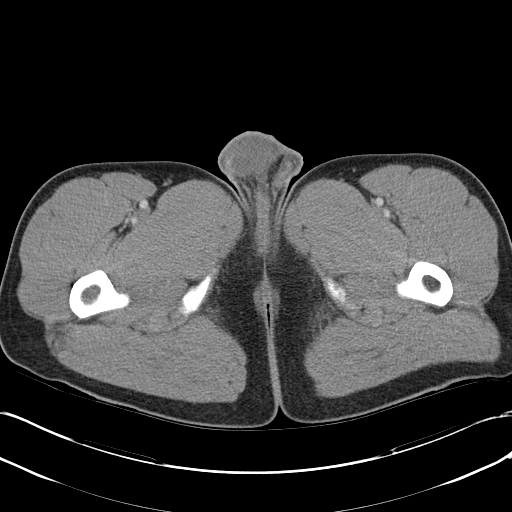
[im 6/90  bone]
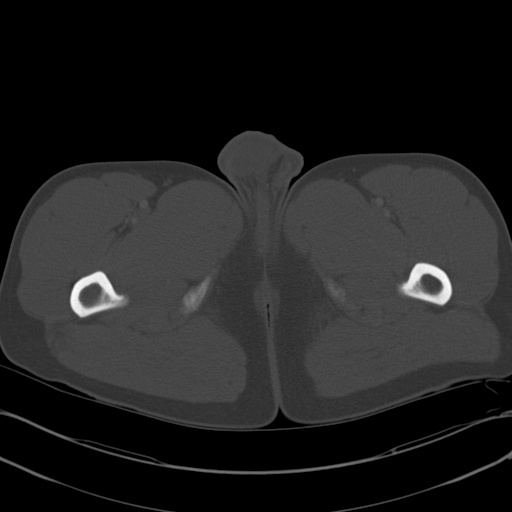
[im 12/90  soft-tissue]
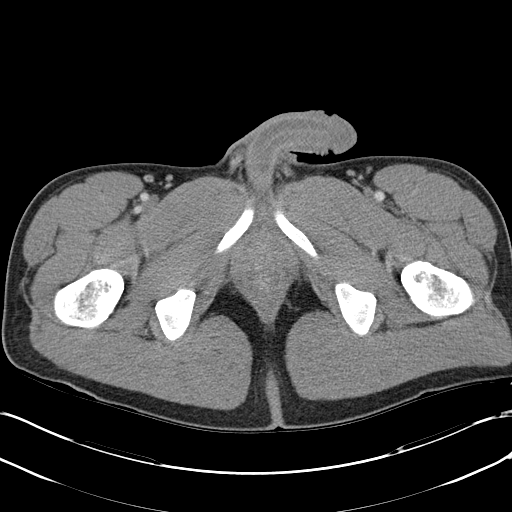
[im 18/90  soft-tissue]
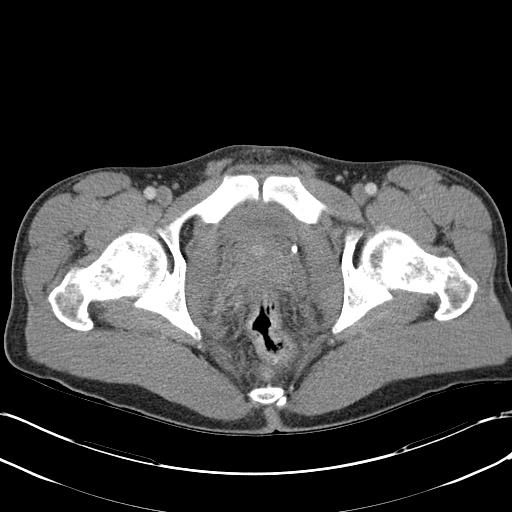
[im 26/90  soft-tissue]
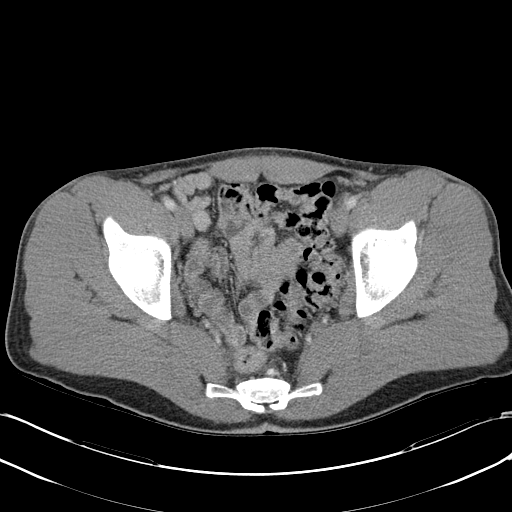
[im 32/90  soft-tissue]
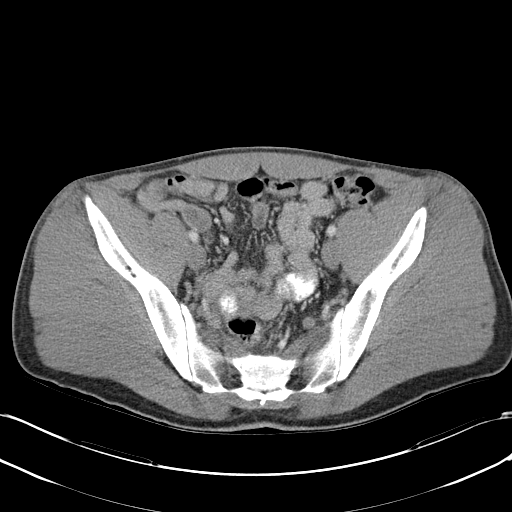
[im 38/90  soft-tissue]
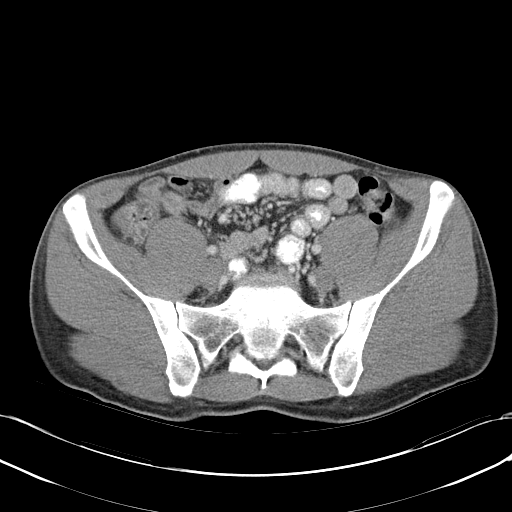
[im 46/90  soft-tissue]
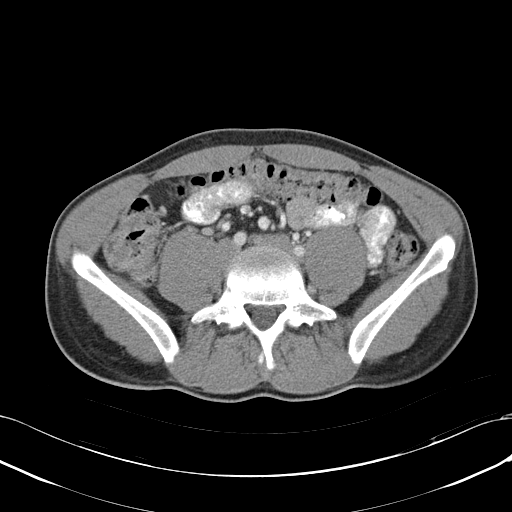
[im 52/90  soft-tissue]
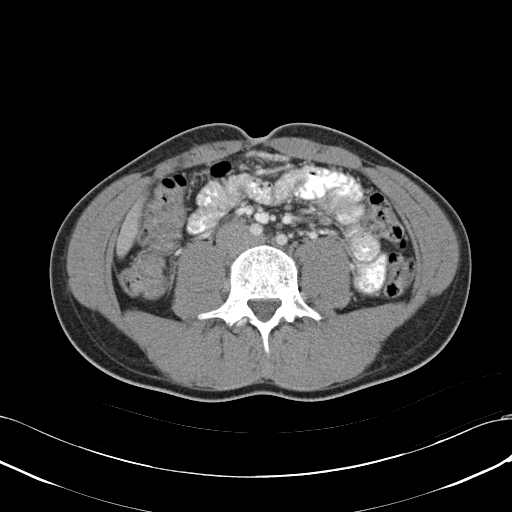
[im 58/90  soft-tissue]
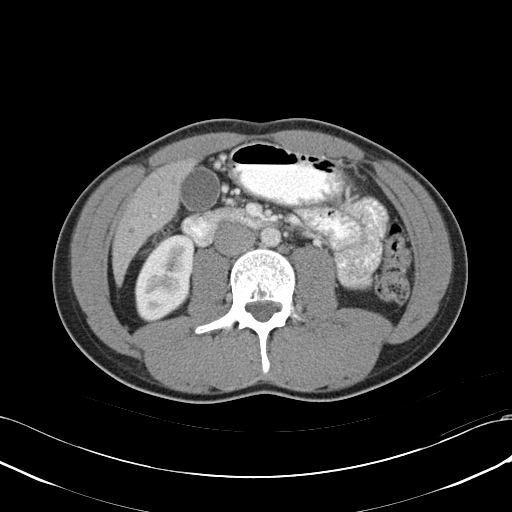
[im 58/90  bone]
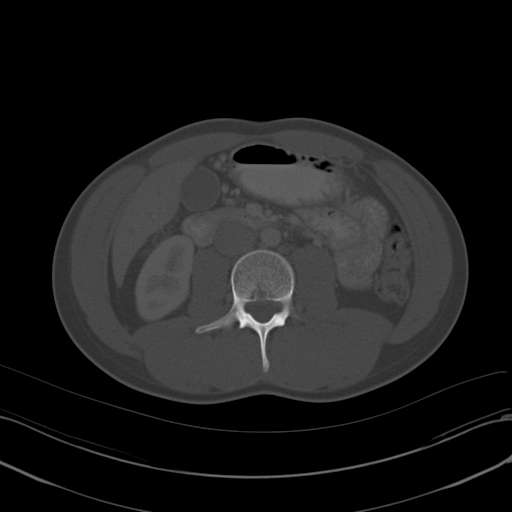
[im 64/90  soft-tissue]
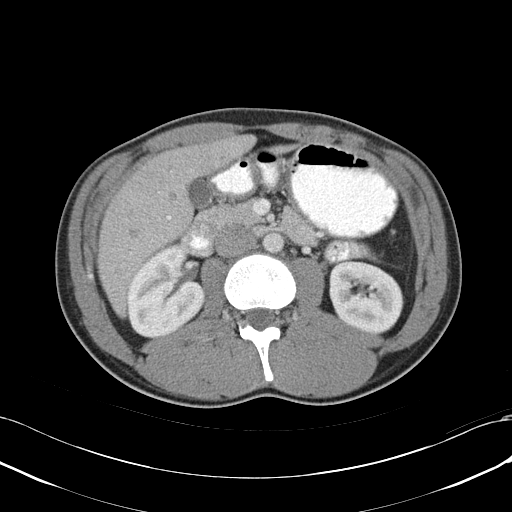
[im 72/90  soft-tissue]
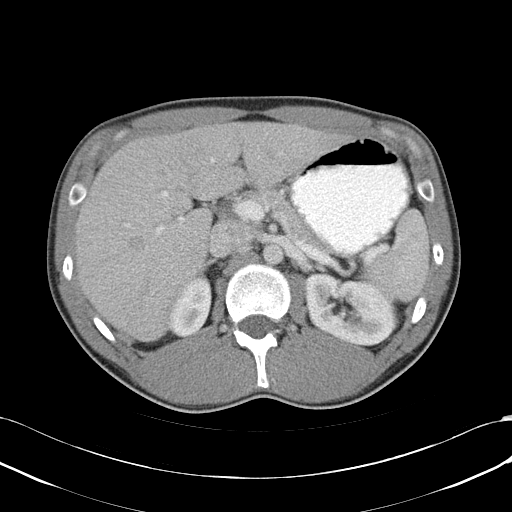
[im 78/90  soft-tissue]
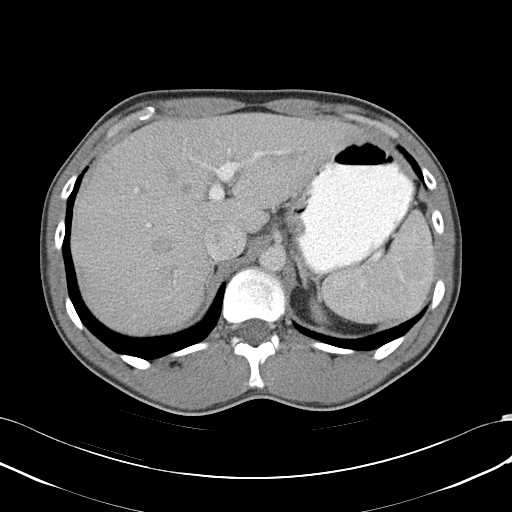
[im 84/90  soft-tissue]
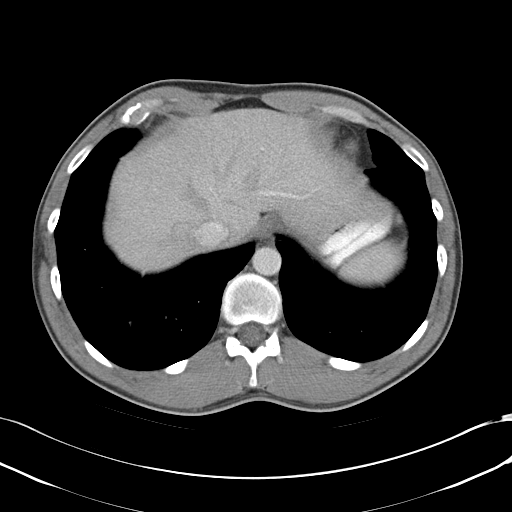

[Series 3: abd_pel_with 3.0 spo cor · coronal · 0.74mm/px · 3 of 73 slices shown]
[im 25/73  soft-tissue]
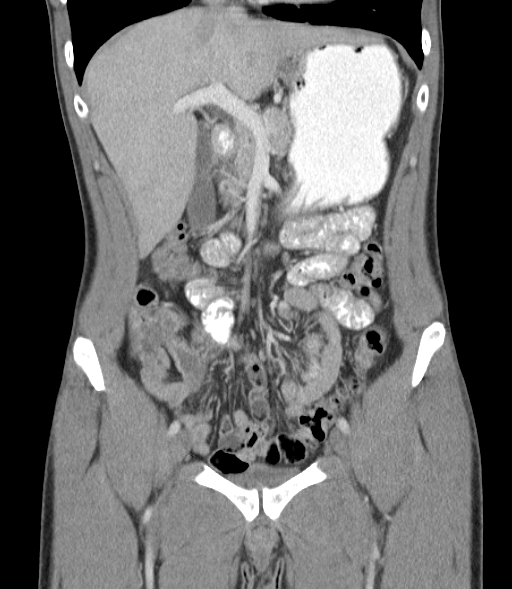
[im 33/73  soft-tissue]
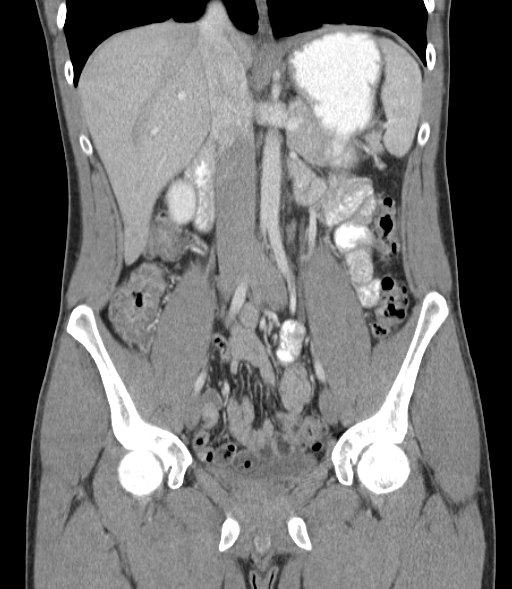
[im 41/73  soft-tissue]
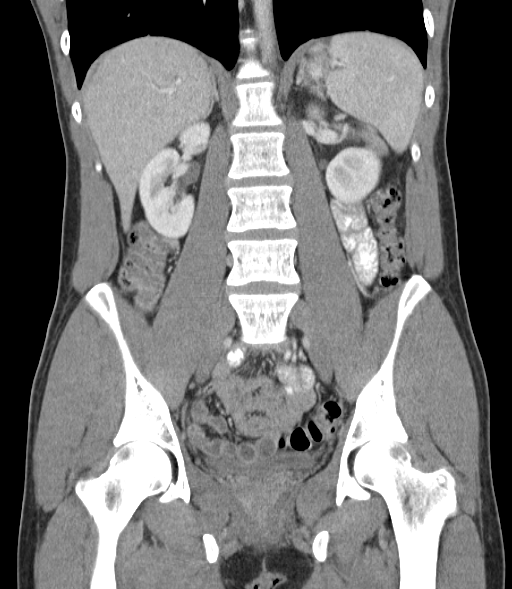

[16 of 46 positions shown; findings below may reference images not displayed]

FINDINGS: Lower Chest: The lung bases are clear. Visualized cardiac structures
are within normal limits for size. No pericardial effusion.
Unremarkable visualized distal thoracic esophagus.

Abdomen: Unremarkable CT appearance of the stomach, duodenum,
spleen, adrenal glands and pancreas. Normal hepatic contour and
morphology. No discrete hepatic lesion. Portal veins are patent.
Gallbladder is unremarkable. No intra or extrahepatic biliary ductal
dilatation.

Unremarkable appearance of the bilateral kidneys. No focal solid
lesion, hydronephrosis or nephrolithiasis.

No evidence of obstruction or focal bowel wall thickening. Normal
appendix in the right lower quadrant. The terminal ileum is
unremarkable. No free fluid or suspicious adenopathy.

Pelvis: The bladder is decompressed. The seminal vesicles and
prostate gland appears slightly edematous and ill-defined. There is
also a suggestion of hyper enhancement. There may be a small amounts
of fluid layering dependently within the pelvis.

Bones/Soft Tissues: No acute fracture or aggressive appearing lytic
or blastic osseous lesion.

Vascular: No significant atherosclerotic vascular disease,
aneurysmal dilatation or acute abnormality. Two small vascular
phleboliths are noted in the left anatomic pelvis.
IMPRESSION: Ill-defined, edematous and slightly hypervascular prostate gland and
seminal vesicles. This constellation of findings suggests acute
prostatitis.

Trace free fluid in the pelvis is likely reactive.

No evidence of hydronephrosis or nephroureterolithiasis.

## 2017-10-26 ENCOUNTER — Emergency Department (HOSPITAL_COMMUNITY)
Admission: EM | Admit: 2017-10-26 | Discharge: 2017-10-26 | Disposition: A | Discharge status: Home / Self Care | Payer: BLUE CROSS/BLUE SHIELD | Attending: Emergency Medicine | Admitting: Emergency Medicine

## 2017-10-26 ENCOUNTER — Encounter (HOSPITAL_COMMUNITY): Payer: Self-pay | Admitting: Emergency Medicine

## 2017-10-26 ENCOUNTER — Emergency Department (HOSPITAL_COMMUNITY): Payer: BLUE CROSS/BLUE SHIELD

## 2017-10-26 ENCOUNTER — Other Ambulatory Visit: Payer: Self-pay

## 2017-10-26 DIAGNOSIS — Z79899 Other long term (current) drug therapy: Secondary | ICD-10-CM | POA: Diagnosis not present

## 2017-10-26 DIAGNOSIS — K529 Noninfective gastroenteritis and colitis, unspecified: Secondary | ICD-10-CM

## 2017-10-26 DIAGNOSIS — K5289 Other specified noninfective gastroenteritis and colitis: Secondary | ICD-10-CM | POA: Diagnosis not present

## 2017-10-26 DIAGNOSIS — R1032 Left lower quadrant pain: Secondary | ICD-10-CM | POA: Diagnosis present

## 2017-10-26 HISTORY — DX: Calculus of kidney: N20.0

## 2017-10-26 LAB — URINALYSIS, ROUTINE W REFLEX MICROSCOPIC
BACTERIA UA: NONE SEEN
Bilirubin Urine: NEGATIVE
Glucose, UA: NEGATIVE mg/dL
HGB URINE DIPSTICK: NEGATIVE
KETONES UR: 5 mg/dL — AB
Leukocytes, UA: NEGATIVE
NITRITE: NEGATIVE
PROTEIN: 30 mg/dL — AB
Specific Gravity, Urine: 1.024 (ref 1.005–1.030)
Squamous Epithelial / LPF: NONE SEEN
WBC UA: NONE SEEN WBC/hpf (ref 0–5)
pH: 8 (ref 5.0–8.0)

## 2017-10-26 LAB — COMPREHENSIVE METABOLIC PANEL
ALBUMIN: 4.8 g/dL (ref 3.5–5.0)
ALT: 18 U/L (ref 17–63)
ANION GAP: 9 (ref 5–15)
AST: 27 U/L (ref 15–41)
Alkaline Phosphatase: 61 U/L (ref 38–126)
BILIRUBIN TOTAL: 0.9 mg/dL (ref 0.3–1.2)
BUN: 16 mg/dL (ref 6–20)
CHLORIDE: 103 mmol/L (ref 101–111)
CO2: 24 mmol/L (ref 22–32)
Calcium: 9.7 mg/dL (ref 8.9–10.3)
Creatinine, Ser: 0.92 mg/dL (ref 0.61–1.24)
GFR calc Af Amer: >60 mL/min (ref >60)
GLUCOSE: 88 mg/dL (ref 65–99)
POTASSIUM: 4 mmol/L (ref 3.5–5.1)
Sodium: 136 mmol/L (ref 135–145)
TOTAL PROTEIN: 8 g/dL (ref 6.5–8.1)

## 2017-10-26 LAB — CBC
HEMATOCRIT: 47.4 % (ref 39.0–52.0)
HEMOGLOBIN: 15.9 g/dL (ref 13.0–17.0)
MCH: 30.6 pg (ref 26.0–34.0)
MCHC: 33.5 g/dL (ref 30.0–36.0)
MCV: 91.3 fL (ref 78.0–100.0)
Platelets: 210 10*3/uL (ref 150–400)
RBC: 5.19 MIL/uL (ref 4.22–5.81)
RDW: 13 % (ref 11.5–15.5)
WBC: 6.9 10*3/uL (ref 4.0–10.5)

## 2017-10-26 LAB — LIPASE, BLOOD: Lipase: 24 U/L (ref 11–51)

## 2017-10-26 MED ORDER — NAPROXEN 500 MG PO TABS: 500.0000 mg | ORAL_TABLET | Freq: Two times a day (BID) | ORAL | 0 refills | Status: DC

## 2017-10-26 MED ORDER — CIPROFLOXACIN HCL 500 MG PO TABS: 500.0000 mg | ORAL_TABLET | Freq: Two times a day (BID) | ORAL | 0 refills | Status: DC

## 2017-10-26 MED ORDER — KETOROLAC TROMETHAMINE 30 MG/ML IJ SOLN
30.0000 mg | Freq: Once | INTRAMUSCULAR | Status: AC
  Administered 2017-10-26: 30 mg via INTRAVENOUS
  Filled 2017-10-26: qty 1

## 2017-10-26 MED ORDER — METRONIDAZOLE 500 MG PO TABS: 500.0000 mg | ORAL_TABLET | Freq: Three times a day (TID) | ORAL | 0 refills | Status: DC

## 2017-10-26 MED ORDER — SODIUM CHLORIDE 0.9 % IV SOLN
INTRAVENOUS | Status: DC
  Administered 2017-10-26: 07:00:00 via INTRAVENOUS

## 2017-10-26 NOTE — ED Provider Notes (Signed)
[facility] EMERGENCY DEPARTMENT Provider Note   CSN: [id number] Arrival date & time: 10/26/17  0700     History   Chief Complaint Chief Complaint  Patient presents with  . Abdominal Pain    HPI Miguel Spears is a 26 y.o. male.  HPI Patient presents to the emergency room for evaluation of left-sided and suprapubic abdominal pain.  Patient states he started having symptoms yesterday.  The pain was not as severe as this morning.  Intermittently he was having some discomfort in the suprapubic region as well as the left periumbilical side.  He was able to work yesterday.  Last evening the pain was a little worse.  He decided he would see if it resolved by the morning.  This morning he woke up he had severe intense pain in the left side.  It has eased off again slightly but has not resolved.  The pain is sharp.  He has the urge to urinate but has not been able to do so.  He also feels like he has to have a bowel movement but was not able to do so either.  He denies any fever.  No nausea or vomiting.  He has a history of kidney stones and thinks that could be going on today. Past Medical History:  Diagnosis Date  . Kidney stone     There are no active problems to display for this patient.   Past Surgical History:  Procedure Laterality Date  . CYST REMOVAL PEDIATRIC         Home Medications    Prior to Admission medications   Medication Sig Start Date End Date Taking? Authorizing Provider  acetaminophen (TYLENOL) 325 MG tablet Take 975 mg by mouth every 6 (six) hours as needed for mild pain or moderate pain.   Yes [provider]  ibuprofen (ADVIL,MOTRIN) 200 MG tablet Take 600 mg by mouth every 6 (six) hours as needed for moderate pain.    Yes [provider]  ciprofloxacin (CIPRO) 500 MG tablet Take 1 tablet (500 mg total) by mouth 2 (two) times daily. 10/26/17   Linwood Dibbles, MD  metroNIDAZOLE (FLAGYL) 500 MG tablet Take 1 tablet (500 mg total) by mouth 3  (three) times daily. 10/26/17   Linwood Dibbles, MD  naproxen (NAPROSYN) 500 MG tablet Take 1 tablet (500 mg total) by mouth 2 (two) times daily. 10/26/17   Linwood Dibbles, MD    Family History History reviewed. No pertinent family history.  Social History Social History   Tobacco Use  . Smoking status: Never Smoker  . Smokeless tobacco: Never Used  Substance Use Topics  . Alcohol use: Yes    Comment: occasionally  . Drug use: Yes    Types: Marijuana     Allergies   Pineapple and Coconut flavor   Review of Systems Review of Systems  All other systems reviewed and are negative.    Physical Exam Updated Vital Signs Ht 1.803 m (5\' 11" )   Wt 70.3 kg (155 lb)   BMI 21.62 kg/m   Physical Exam  Constitutional: He appears well-developed and well-nourished. No distress.  HENT:  Head: Normocephalic and atraumatic.  Right Ear: External ear normal.  Left Ear: External ear normal.  Eyes: Conjunctivae are normal. Right eye exhibits no discharge. Left eye exhibits no discharge. No scleral icterus.  Neck: Neck supple. No tracheal deviation present.  Cardiovascular: Normal rate, regular rhythm and intact distal pulses.  Pulmonary/Chest: Effort normal and breath sounds normal. No stridor.  No respiratory distress. He has no wheezes. He has no rales.  Abdominal: Soft. Bowel sounds are normal. He exhibits no distension. There is tenderness in the left upper quadrant and left lower quadrant. There is no rebound and no guarding. No hernia. Hernia confirmed negative in the right inguinal area and confirmed negative in the left inguinal area.  Musculoskeletal: He exhibits no edema or tenderness.  Neurological: He is alert. He has normal strength. No cranial nerve deficit (no facial droop, extraocular movements intact, no slurred speech) or sensory deficit. He exhibits normal muscle tone. He displays no seizure activity. Coordination normal.  Skin: Skin is warm and dry. No rash noted.  Psychiatric:  He has a normal mood and affect.  Nursing note and vitals reviewed.    ED Treatments / Results  Labs (all labs ordered are listed, but only abnormal results are displayed) Labs Reviewed  URINALYSIS, ROUTINE W REFLEX MICROSCOPIC - Abnormal; Notable for the following components:      Result Value   Ketones, ur 5 (*)    Protein, ur 30 (*)    All other components within normal limits  LIPASE, BLOOD  COMPREHENSIVE METABOLIC PANEL  CBC     Radiology Ct Renal Stone Study  Result Date: 10/26/2017 CLINICAL DATA:  26 year old male with history of left-sided flank pain without hematuria for 1 day. EXAM: CT ABDOMEN AND PELVIS WITHOUT CONTRAST TECHNIQUE: Multidetector CT imaging of the abdomen and pelvis was performed following the standard protocol without IV contrast. COMPARISON:  CT the abdomen pelvis 09/16/2015. FINDINGS: Lower chest: Unremarkable. Hepatobiliary: No definite cystic or solid hepatic lesions are identified on today's noncontrast CT examination. Unenhanced appearance of the gallbladder is normal. Pancreas: No definite pancreatic mass or peripancreatic inflammatory changes are noted on today's noncontrast CT examination. Spleen: Unremarkable. Adrenals/Urinary Tract: There are no abnormal calcifications within the collecting system of either kidney, along the course of either ureter, or within the lumen of the urinary bladder. No hydroureteronephrosis or perinephric stranding to suggest urinary tract obstruction at this time. The unenhanced appearance of the kidneys is unremarkable bilaterally. Urinary bladder is nearly completely decompressed, but otherwise unremarkable in appearance. Bilateral adrenal glands are normal in appearance. Stomach/Bowel: The unenhanced appearance of the stomach is normal. There is no pathologic dilatation of small bowel or colon. The colon is essentially completely decompressed. Allowing for this there is some subjective mural thickening in the colon most  evident in the region of the hepatic flexure and transverse colon where there also appears to be some subtle hypervascularity of the associated mesocolon and surrounding inflammatory changes, concerning for a colitis. Normal appendix. Vascular/Lymphatic: No atherosclerotic calcifications or definite aneurysm identified in the abdominal and pelvic vasculature. No lymphadenopathy noted in the abdomen or pelvis. Reproductive: Prostate gland and seminal vesicles are unremarkable in appearance. Other: No significant volume of ascites.  No pneumoperitoneum. Musculoskeletal: There are no aggressive appearing lytic or blastic lesions noted in the visualized portions of the skeleton. IMPRESSION: 1. No urinary tract calculi or findings of urinary tract obstruction are noted at this time. 2. There are subtle areas of mural thickening and surrounding inflammatory changes in the colon, most evident from the hepatic flexure to the transverse colon. Findings are concerning for potential colitis. 3. Normal appendix. Electronically Signed   By: Trudie Reedaniel  Entrikin M.D.   On: 10/26/2017 09:12    Procedures Procedures (including critical care time)  Medications Ordered in ED Medications  0.9 %  sodium chloride infusion ( Intravenous New Bag/Given 10/26/17  [id number])  ketorolac (TORADOL) 30 MG/ML injection 30 mg (30 mg Intravenous Given 10/26/17 0728)     Initial Impression / Assessment and Plan / ED Course  I have reviewed the triage vital signs and the nursing notes.  Pertinent labs & imaging results that were available during my care of the patient were reviewed by me and considered in my medical decision making (see chart for details).   Patient CT scan demonstrates subtle areas of inflammation in the colon suggestive of a colitis.  No evidence of ureteral stone.  Patient's laboratory tests are otherwise reassuring.  He is nontoxic and afebrile.  Plan on discharge home with oral antibiotics and recommend follow-up with  his primary care doctor.  Warning signs and precautions discussed.  Final Clinical Impressions(s) / ED Diagnoses   Final diagnoses:  Colitis    ED Discharge Orders        Ordered    naproxen (NAPROSYN) 500 MG tablet  2 times daily     10/26/17 1001    ciprofloxacin (CIPRO) 500 MG tablet  2 times daily     10/26/17 1001    metroNIDAZOLE (FLAGYL) 500 MG tablet  3 times daily     10/26/17 1001       Linwood DibblesKnapp, Anyae Griffith, MD 10/26/17 1003

## 2017-10-26 NOTE — ED Triage Notes (Signed)
Pt states lower abd pain more on left side. Denies radiating to back or flank. Pt states hesitation to urinate and have bowel. Denies n/v/d.

## 2017-10-26 NOTE — ED Notes (Signed)
Pt states he cannot urinate at this time, aware of DO  

## 2017-10-26 NOTE — Discharge Instructions (Signed)
The CT scan suggested some inflammation in the colon region consistent with colitis.  No signs of any abscess, more severe infection or kidney stone.  Take the medications as prescribed.  Follow-up with your doctor next week to be rechecked.  Return for fever , severe vomiting or other concerning symptoms

## 2018-01-07 ENCOUNTER — Other Ambulatory Visit: Payer: Self-pay

## 2018-01-07 ENCOUNTER — Emergency Department (HOSPITAL_COMMUNITY)
Admission: EM | Admit: 2018-01-07 | Discharge: 2018-01-07 | Disposition: A | Discharge status: Home / Self Care | Payer: BLUE CROSS/BLUE SHIELD | Attending: Emergency Medicine | Admitting: Emergency Medicine

## 2018-01-07 ENCOUNTER — Emergency Department (HOSPITAL_COMMUNITY): Payer: BLUE CROSS/BLUE SHIELD

## 2018-01-07 ENCOUNTER — Encounter (HOSPITAL_COMMUNITY): Payer: Self-pay | Admitting: Emergency Medicine

## 2018-01-07 DIAGNOSIS — Z79899 Other long term (current) drug therapy: Secondary | ICD-10-CM | POA: Insufficient documentation

## 2018-01-07 DIAGNOSIS — R42 Dizziness and giddiness: Secondary | ICD-10-CM | POA: Diagnosis not present

## 2018-01-07 MED ORDER — MECLIZINE HCL 12.5 MG PO TABS
25.0000 mg | ORAL_TABLET | Freq: Once | ORAL | Status: AC
  Administered 2018-01-07: 25 mg via ORAL
  Filled 2018-01-07: qty 2

## 2018-01-07 MED ORDER — MECLIZINE HCL 25 MG PO TABS: 25.0000 mg | ORAL_TABLET | Freq: Three times a day (TID) | ORAL | 0 refills | Status: DC | PRN
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
## 2018-01-07 NOTE — Discharge Instructions (Signed)
Take the meclizine medication as directed.  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  Hopefully this will help suppress the dizziness symptoms.  If they do not resolve over the next week he will need to follow-up.  Return for any new or worse symptoms.
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
## 2018-07-26 ENCOUNTER — Encounter (HOSPITAL_COMMUNITY): Payer: Self-pay

## 2018-07-26 ENCOUNTER — Emergency Department (HOSPITAL_COMMUNITY)
Admission: EM | Admit: 2018-07-26 | Discharge: 2018-07-26 | Disposition: A | Discharge status: Home / Self Care | Payer: BLUE CROSS/BLUE SHIELD | Attending: Emergency Medicine | Admitting: Emergency Medicine

## 2018-07-26 DIAGNOSIS — Z79899 Other long term (current) drug therapy: Secondary | ICD-10-CM | POA: Diagnosis not present

## 2018-07-26 DIAGNOSIS — Y999 Unspecified external cause status: Secondary | ICD-10-CM | POA: Diagnosis not present

## 2018-07-26 DIAGNOSIS — S0501XA Injury of conjunctiva and corneal abrasion without foreign body, right eye, initial encounter: Secondary | ICD-10-CM

## 2018-07-26 DIAGNOSIS — W500XXA Accidental hit or strike by another person, initial encounter: Secondary | ICD-10-CM | POA: Insufficient documentation

## 2018-07-26 DIAGNOSIS — Y929 Unspecified place or not applicable: Secondary | ICD-10-CM | POA: Diagnosis not present

## 2018-07-26 DIAGNOSIS — Y9389 Activity, other specified: Secondary | ICD-10-CM | POA: Insufficient documentation

## 2018-07-26 DIAGNOSIS — S0591XA Unspecified injury of right eye and orbit, initial encounter: Secondary | ICD-10-CM | POA: Diagnosis present

## 2018-07-26 MED ORDER — FLUORESCEIN SODIUM 1 MG OP STRP
ORAL_STRIP | OPHTHALMIC | Status: AC
  Filled 2018-07-26: qty 1

## 2018-07-26 MED ORDER — TETRACAINE HCL 0.5 % OP SOLN
OPHTHALMIC | Status: AC
  Filled 2018-07-26: qty 4

## 2018-07-26 MED ORDER — ERYTHROMYCIN 5 MG/GM OP OINT
TOPICAL_OINTMENT | Freq: Once | OPHTHALMIC | Status: AC
  Administered 2018-07-26: 07:00:00 via OPHTHALMIC
  Filled 2018-07-26: qty 3.5

## 2018-07-26 NOTE — ED Triage Notes (Signed)
Pt was playing with his child last evening and the child caught him in his right eye with his fingernail.  Pt states he has not been able to open the eye due to the pain since then

## 2018-07-26 NOTE — ED Provider Notes (Signed)
[facility] EMERGENCY DEPARTMENT Provider Note   CSN: [id number] Arrival date & time: 07/26/18  0550     History   Chief Complaint Chief Complaint  Patient presents with  . Eye Injury    HPI Miguel Spears is a 27 y.o. male.  Patient is a 27 year old male presenting with complaints of right eye pain.  He states he was playing with his young child yesterday evening when his thumbnail scratched his eye.    The history is provided by the patient.  Eye Injury  This is a new problem. Episode onset: Yesterday evening. The problem occurs constantly. The problem has not changed since onset.Nothing aggravates the symptoms. Nothing relieves the symptoms. He has tried nothing for the symptoms.    Past Medical History:  Diagnosis Date  . Kidney stone     There are no active problems to display for this patient.   Past Surgical History:  Procedure Laterality Date  . CYST REMOVAL PEDIATRIC          Home Medications    Prior to Admission medications   Medication Sig Start Date End Date Taking? Authorizing Provider  acetaminophen (TYLENOL) 325 MG tablet Take 975 mg by mouth every 6 (six) hours as needed for mild pain or moderate pain.    [provider]  ciprofloxacin (CIPRO) 500 MG tablet Take 1 tablet (500 mg total) by mouth 2 (two) times daily. 10/26/17   Linwood Dibbles, MD  ibuprofen (ADVIL,MOTRIN) 200 MG tablet Take 600 mg by mouth every 6 (six) hours as needed for moderate pain.     [provider]  meclizine (ANTIVERT) 25 MG tablet Take 1 tablet (25 mg total) by mouth 3 (three) times daily as needed for dizziness. 01/07/18   Vanetta Mulders, MD  metroNIDAZOLE (FLAGYL) 500 MG tablet Take 1 tablet (500 mg total) by mouth 3 (three) times daily. 10/26/17   Linwood Dibbles, MD  naproxen (NAPROSYN) 500 MG tablet Take 1 tablet (500 mg total) by mouth 2 (two) times daily. 10/26/17   Linwood Dibbles, MD    Family History No family history on file.  Social History Social  History   Tobacco Use  . Smoking status: Never Smoker  . Smokeless tobacco: Never Used  Substance Use Topics  . Alcohol use: Yes    Comment: occasionally  . Drug use: Yes    Types: Marijuana     Allergies   Pineapple and Coconut flavor   Review of Systems Review of Systems  All other systems reviewed and are negative.    Physical Exam Updated Vital Signs BP (!) 136/91 (BP Location: Left Arm)   Pulse 61   Temp 97.9 F (36.6 C) (Oral)   Resp 18   Ht 5\' 10"  (1.778 m)   Wt 72.6 kg   SpO2 98%   BMI 22.96 kg/m   Physical Exam  Constitutional: He is oriented to person, place, and time. He appears well-developed and well-nourished. No distress.  HENT:  Head: Normocephalic and atraumatic.  Mouth/Throat: Oropharynx is clear and moist.  Eyes: Pupils are equal, round, and reactive to light.  There is an abrasion noted at approximately 9:00 to the right cornea with fluorescein staining.  There is no hyphema and the pupil is reactive.  Neck: Normal range of motion. Neck supple.  Pulmonary/Chest: Effort normal.  Neurological: He is alert and oriented to person, place, and time. Coordination normal.  Skin: Skin is warm and dry. He is not diaphoretic.  Nursing note and vitals  reviewed.    ED Treatments / Results  Labs (all labs ordered are listed, but only abnormal results are displayed) Labs Reviewed - No data to display  EKG None  Radiology No results found.  Procedures Procedures (including critical care time)  Medications Ordered in ED Medications  tetracaine (PONTOCAINE) 0.5 % ophthalmic solution (has no administration in time range)  fluorescein 1 MG ophthalmic strip (has no administration in time range)     Initial Impression / Assessment and Plan / ED Course  I have reviewed the triage vital signs and the nursing notes.  Pertinent labs & imaging results that were available during my care of the patient were reviewed by me and considered in my medical  decision making (see chart for details).  Floor seen staining reveals a small to medium sized abrasion to the right eye.  This will be treated with Ilotycin and follow-up as needed.  Final Clinical Impressions(s) / ED Diagnoses   Final diagnoses:  None    ED Discharge Orders    None       Geoffery Lyonselo, Neeti Knudtson, MD 07/26/18 [phone]

## 2018-07-26 NOTE — Discharge Instructions (Addendum)
Ilotycin ointment: Apply a 1/2 cm length ribbon inside the lower eyelid 3 times daily for the next 4 days.  Ibuprofen 600 mg every 6 hours as needed for pain.  Return to the emergency department if your symptoms significantly worsen or change over the next 2 to 3 days.

## 2019-05-10 ENCOUNTER — Other Ambulatory Visit: Payer: BLUE CROSS/BLUE SHIELD

## 2019-05-10 ENCOUNTER — Telehealth: Payer: Self-pay | Admitting: *Deleted

## 2019-05-10 DIAGNOSIS — Z20822 Contact with and (suspected) exposure to covid-19: Secondary | ICD-10-CM

## 2019-05-10 NOTE — Telephone Encounter (Signed)
Referral from [facility] Department for COVID-19 testing.    Pt's wife answered the phone and states she was the person that called the health department regarding testing. Patient is at work at this time and not available. Pt scheduled for testing at [facility] site on 05/10/19 at 3:45 pm. Pt's wife advised that the pt will need to wear a mask and remain in the car at the time of the appt. Understanding verbalized.

## 2019-05-16 LAB — NOVEL CORONAVIRUS, NAA: SARS-CoV-2, NAA: NOT DETECTED

## 2020-09-20 ENCOUNTER — Other Ambulatory Visit: Payer: Self-pay

## 2020-09-20 ENCOUNTER — Emergency Department (HOSPITAL_COMMUNITY)
Admission: EM | Admit: 2020-09-20 | Discharge: 2020-09-20 | Disposition: A | Discharge status: Home / Self Care | Payer: BC Managed Care – PPO | Attending: Emergency Medicine | Admitting: Emergency Medicine

## 2020-09-20 ENCOUNTER — Encounter (HOSPITAL_COMMUNITY): Payer: Self-pay | Admitting: Emergency Medicine

## 2020-09-20 DIAGNOSIS — M549 Dorsalgia, unspecified: Secondary | ICD-10-CM | POA: Diagnosis present

## 2020-09-20 DIAGNOSIS — M5432 Sciatica, left side: Secondary | ICD-10-CM | POA: Insufficient documentation

## 2020-09-20 MED ORDER — HYDROCODONE-ACETAMINOPHEN 5-325 MG PO TABS: 1.0000 | ORAL_TABLET | ORAL | 0 refills | Status: DC | PRN

## 2020-09-20 MED ORDER — DEXAMETHASONE SODIUM PHOSPHATE 10 MG/ML IJ SOLN
10.0000 mg | Freq: Once | INTRAMUSCULAR | Status: AC
  Administered 2020-09-20: 10 mg via INTRAMUSCULAR
  Filled 2020-09-20: qty 1

## 2020-09-20 MED ORDER — PREDNISONE 10 MG (21) PO TBPK: ORAL_TABLET | ORAL | 0 refills | Status: DC

## 2020-09-20 MED ORDER — DIAZEPAM 5 MG PO TABS: 5.0000 mg | ORAL_TABLET | Freq: Two times a day (BID) | ORAL | 0 refills | Status: DC

## 2020-09-20 MED ORDER — IBUPROFEN 600 MG PO TABS: 600.0000 mg | ORAL_TABLET | Freq: Four times a day (QID) | ORAL | 0 refills | Status: DC | PRN

## 2020-09-20 MED ORDER — HYDROCODONE-ACETAMINOPHEN 5-325 MG PO TABS
1.0000 | ORAL_TABLET | Freq: Once | ORAL | Status: AC
  Administered 2020-09-20: 1 via ORAL
  Filled 2020-09-20: qty 1

## 2020-09-20 MED ORDER — DIAZEPAM 5 MG PO TABS
5.0000 mg | ORAL_TABLET | Freq: Once | ORAL | Status: AC
  Administered 2020-09-20: 5 mg via ORAL
  Filled 2020-09-20: qty 1

## 2020-09-20 MED ORDER — KETOROLAC TROMETHAMINE 30 MG/ML IJ SOLN
30.0000 mg | Freq: Once | INTRAMUSCULAR | Status: AC
  Administered 2020-09-20: 30 mg via INTRAMUSCULAR
  Filled 2020-09-20: qty 1

## 2020-09-20 NOTE — ED Triage Notes (Signed)
Pt c/o lower back pain that started yesterday after bending over to pick shoes up.
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
## 2020-09-30 ENCOUNTER — Other Ambulatory Visit: Payer: Self-pay | Admitting: Rehabilitation

## 2020-09-30 ENCOUNTER — Other Ambulatory Visit (HOSPITAL_COMMUNITY): Payer: Self-pay | Admitting: Rehabilitation

## 2020-09-30 DIAGNOSIS — M5106 Intervertebral disc disorders with myelopathy, lumbar region: Secondary | ICD-10-CM

## 2020-10-15 ENCOUNTER — Other Ambulatory Visit: Payer: Self-pay

## 2020-10-15 ENCOUNTER — Ambulatory Visit (HOSPITAL_COMMUNITY)
Admission: RE | Admit: 2020-10-15 | Discharge: 2020-10-15 | Disposition: A | Discharge status: Home / Self Care | Payer: BC Managed Care – PPO | Source: Ambulatory Visit | Attending: Rehabilitation | Admitting: Rehabilitation

## 2020-10-15 DIAGNOSIS — M5106 Intervertebral disc disorders with myelopathy, lumbar region: Secondary | ICD-10-CM | POA: Diagnosis present

## 2020-11-11 ENCOUNTER — Ambulatory Visit: Payer: Self-pay

## 2020-11-27 ENCOUNTER — Ambulatory Visit: Payer: Self-pay

## 2021-08-26 ENCOUNTER — Emergency Department (HOSPITAL_COMMUNITY)
Admission: EM | Admit: 2021-08-26 | Discharge: 2021-08-26 | Disposition: A | Discharge status: Home / Self Care | Payer: Medicaid Other | Attending: Student | Admitting: Student

## 2021-08-26 ENCOUNTER — Encounter (HOSPITAL_COMMUNITY): Payer: Self-pay | Admitting: Emergency Medicine

## 2021-08-26 ENCOUNTER — Other Ambulatory Visit: Payer: Self-pay

## 2021-08-26 ENCOUNTER — Emergency Department (HOSPITAL_COMMUNITY): Payer: Medicaid Other

## 2021-08-26 DIAGNOSIS — R0789 Other chest pain: Secondary | ICD-10-CM | POA: Diagnosis present

## 2021-08-26 DIAGNOSIS — R091 Pleurisy: Secondary | ICD-10-CM | POA: Insufficient documentation

## 2021-08-26 MED ORDER — IBUPROFEN 800 MG PO TABS
800.0000 mg | ORAL_TABLET | Freq: Once | ORAL | Status: AC
  Administered 2021-08-26: 800 mg via ORAL
  Filled 2021-08-26: qty 1

## 2021-08-26 MED ORDER — HYDROCODONE-ACETAMINOPHEN 5-325 MG PO TABS: 1.0000 | ORAL_TABLET | ORAL | 0 refills | Status: DC | PRN

## 2021-08-26 MED ORDER — PREDNISONE 50 MG PO TABS: ORAL_TABLET | ORAL | 0 refills | Status: DC

## 2021-08-26 NOTE — ED Triage Notes (Signed)
Pt reports left sided rib pain that is worse with lying and deep breathing. Pt denies injury to the area.

## 2021-08-26 NOTE — Discharge Instructions (Addendum)
Return if any problems.

## 2021-08-26 NOTE — ED Provider Notes (Signed)
[facility] EMERGENCY DEPARTMENT Provider Note   CSN: [id number] Arrival date & time: 08/26/21  1615     History Chief Complaint  Patient presents with   Chest Pain    Miguel Spears is a 30 y.o. male.  Pt reports he has a popping in the left side of his chest   The history is provided by the patient. No language interpreter was used.  Chest Pain Pain location:  L lateral chest Pain quality: aching   Pain radiates to:  Does not radiate Pain severity:  Moderate Onset quality:  Gradual Timing:  Constant Progression:  Worsening Relieved by:  Nothing Worsened by:  Nothing Ineffective treatments:  None tried     Past Medical History:  Diagnosis Date   Kidney stone     There are no problems to display for this patient.   Past Surgical History:  Procedure Laterality Date   CYST REMOVAL PEDIATRIC         No family history on file.  Social History   Tobacco Use   Smoking status: Never   Smokeless tobacco: Never  Vaping Use   Vaping Use: Never used  Substance Use Topics   Alcohol use: Yes    Comment: occasionally   Drug use: Yes    Types: Marijuana    Home Medications Prior to Admission medications   Medication Sig Start Date End Date Taking? Authorizing Provider  HYDROcodone-acetaminophen (NORCO/VICODIN) 5-325 MG tablet Take 1 tablet by mouth every 4 (four) hours as needed for moderate pain. 08/26/21 08/26/22 Yes Elson Areas, PA-C  predniSONE (DELTASONE) 50 MG tablet One tablet a day for 5 days 08/26/21  Yes Elson Areas, PA-C  acetaminophen (TYLENOL) 325 MG tablet Take 975 mg by mouth every 6 (six) hours as needed for mild pain or moderate pain.    [provider]  ciprofloxacin (CIPRO) 500 MG tablet Take 1 tablet (500 mg total) by mouth 2 (two) times daily. 10/26/17   Linwood Dibbles, MD  diazepam (VALIUM) 5 MG tablet Take 1 tablet (5 mg total) by mouth 2 (two) times daily. 09/20/20   Jacalyn Lefevre, MD  ibuprofen (ADVIL) 600 MG tablet Take  1 tablet (600 mg total) by mouth every 6 (six) hours as needed. 09/20/20   Jacalyn Lefevre, MD  meclizine (ANTIVERT) 25 MG tablet Take 1 tablet (25 mg total) by mouth 3 (three) times daily as needed for dizziness. 01/07/18   Vanetta Mulders, MD  metroNIDAZOLE (FLAGYL) 500 MG tablet Take 1 tablet (500 mg total) by mouth 3 (three) times daily. 10/26/17   Linwood Dibbles, MD  naproxen (NAPROSYN) 500 MG tablet Take 1 tablet (500 mg total) by mouth 2 (two) times daily. 10/26/17   Linwood Dibbles, MD    Allergies    Pineapple and Coconut flavor  Review of Systems   Review of Systems  Cardiovascular:  Positive for chest pain.  All other systems reviewed and are negative.  Physical Exam Updated Vital Signs BP (!) 159/106   Pulse 66   Temp 98.5 F (36.9 C) (Oral)   Resp 18   SpO2 100%   Physical Exam Vitals reviewed.  Constitutional:      Appearance: He is well-developed.  Cardiovascular:     Rate and Rhythm: Normal rate and regular rhythm.     Heart sounds: Normal heart sounds.  Pulmonary:     Effort: Pulmonary effort is normal.  Abdominal:     Palpations: Abdomen is soft.  Musculoskeletal:  General: Normal range of motion.  Skin:    General: Skin is warm.  Neurological:     General: No focal deficit present.     Mental Status: He is alert.    ED Results / Procedures / Treatments   Labs (all labs ordered are listed, but only abnormal results are displayed) Labs Reviewed - No data to display  EKG None  Radiology DG Chest 2 View  Result Date: 08/26/2021 CLINICAL DATA:  Left-sided rib pain. EXAM: CHEST - 2 VIEW COMPARISON:  10/27/2020 FINDINGS: The heart size and mediastinal contours are within normal limits. Both lungs are clear. Subacute to chronic appearing left lateral fifth and 6 rib fractures are identified and appear new since the comparison exam. No acute fractures identified. IMPRESSION: 1. No active cardiopulmonary abnormalities. 2. Subacute to chronic appearing left  lateral fifth and sixth rib fractures. Electronically Signed   By: Signa Kell M.D.   On: 08/26/2021 17:22    Procedures Procedures   Medications Ordered in ED Medications  ibuprofen (ADVIL) tablet 800 mg (800 mg Oral Given 08/26/21 1805)    ED Course  I have reviewed the triage vital signs and the nursing notes.  Pertinent labs & imaging results that were available during my care of the patient were reviewed by me and considered in my medical decision making (see chart for details).    MDM Rules/Calculators/A&P                           MDM:  Chest xray shows old fracture.  Pt counseled on pleurisy.  Pt given rx for prednsisone and hydrocodone   Final Clinical Impression(s) / ED Diagnoses Final diagnoses:  Pleurisy    Rx / DC Orders ED Discharge Orders          Ordered    predniSONE (DELTASONE) 50 MG tablet        08/26/21 1755    HYDROcodone-acetaminophen (NORCO/VICODIN) 5-325 MG tablet  Every 4 hours PRN        08/26/21 1755          An After Visit Summary was printed and given to the patient.    Elson Areas, Cordelia Poche 08/26/21 1819    Glendora Score, MD 08/27/21 [phone]

## 2022-02-10 ENCOUNTER — Emergency Department (HOSPITAL_COMMUNITY)
Admission: EM | Admit: 2022-02-10 | Discharge: 2022-02-10 | Disposition: A | Discharge status: Home / Self Care | Payer: Medicaid Other | Attending: Emergency Medicine | Admitting: Emergency Medicine

## 2022-02-10 ENCOUNTER — Other Ambulatory Visit: Payer: Self-pay

## 2022-02-10 ENCOUNTER — Encounter (HOSPITAL_COMMUNITY): Payer: Self-pay

## 2022-02-10 DIAGNOSIS — D72829 Elevated white blood cell count, unspecified: Secondary | ICD-10-CM | POA: Insufficient documentation

## 2022-02-10 DIAGNOSIS — Z23 Encounter for immunization: Secondary | ICD-10-CM | POA: Insufficient documentation

## 2022-02-10 DIAGNOSIS — L03116 Cellulitis of left lower limb: Secondary | ICD-10-CM | POA: Insufficient documentation

## 2022-02-10 DIAGNOSIS — R2242 Localized swelling, mass and lump, left lower limb: Secondary | ICD-10-CM | POA: Diagnosis present

## 2022-02-10 LAB — BASIC METABOLIC PANEL
Anion gap: 10 (ref 5–15)
BUN: 9 mg/dL (ref 6–20)
CO2: 26 mmol/L (ref 22–32)
Calcium: 9.4 mg/dL (ref 8.9–10.3)
Chloride: 100 mmol/L (ref 98–111)
Creatinine, Ser: 0.91 mg/dL (ref 0.61–1.24)
GFR, Estimated: >60 mL/min (ref >60)
Glucose, Bld: 90 mg/dL (ref 70–99)
Potassium: 3.2 mmol/L — ABNORMAL LOW (ref 3.5–5.1)
Sodium: 136 mmol/L (ref 135–145)

## 2022-02-10 LAB — CBC
HCT: 44.8 % (ref 39.0–52.0)
Hemoglobin: 14.8 g/dL (ref 13.0–17.0)
MCH: 29.5 pg (ref 26.0–34.0)
MCHC: 33 g/dL (ref 30.0–36.0)
MCV: 89.2 fL (ref 80.0–100.0)
Platelets: 290 10*3/uL (ref 150–400)
RBC: 5.02 MIL/uL (ref 4.22–5.81)
RDW: 12.9 % (ref 11.5–15.5)
WBC: 10.8 10*3/uL — ABNORMAL HIGH (ref 4.0–10.5)
nRBC: 0 % (ref 0.0–0.2)

## 2022-02-10 MED ORDER — SULFAMETHOXAZOLE-TRIMETHOPRIM 800-160 MG PO TABS: ORAL_TABLET | ORAL | 0 refills | Status: DC

## 2022-02-10 MED ORDER — VANCOMYCIN HCL IN DEXTROSE 1-5 GM/200ML-% IV SOLN
1000.0000 mg | Freq: Once | INTRAVENOUS | Status: AC
  Administered 2022-02-10: 1000 mg via INTRAVENOUS
  Filled 2022-02-10: qty 200

## 2022-02-10 MED ORDER — DIPHENHYDRAMINE HCL 50 MG/ML IJ SOLN
25.0000 mg | Freq: Once | INTRAMUSCULAR | Status: AC
  Administered 2022-02-10: 25 mg via INTRAVENOUS
  Filled 2022-02-10: qty 1

## 2022-02-10 MED ORDER — TETANUS-DIPHTH-ACELL PERTUSSIS 5-2.5-18.5 LF-MCG/0.5 IM SUSY
0.5000 mL | PREFILLED_SYRINGE | Freq: Once | INTRAMUSCULAR | Status: AC
  Administered 2022-02-10: 0.5 mL via INTRAMUSCULAR
  Filled 2022-02-10: qty 0.5

## 2022-02-10 NOTE — ED Triage Notes (Signed)
Patient complaining of left foot pain and swelling. Patient had a small cut to left outer ankle on Monday now has redness and swelling.  ?

## 2022-02-10 NOTE — ED Notes (Signed)
Complaints of head itching. No signs of swelling, no complaints of shortness of breath. No rash noted.  ?

## 2022-02-10 NOTE — ED Provider Notes (Signed)
?[facility] ?Provider Note ? ? ?CSN: [id number] ?Arrival date & time: 02/10/22  [id number] ? ?  ? ?History ? ?Chief Complaint  ?Patient presents with  ? Foot Swelling  ? ? ?Miguel Spears is a 31 y.o. male. ? ?Patient with swelling to left foot after hitting some iron a couple days ago.  Patient has no history of any medical ? ?The history is provided by the patient and medical records. No language interpreter was used.  ?Foot Pain ?This is a new problem. The current episode started 6 to 12 hours ago. The problem occurs constantly. The problem has not changed since onset.Pertinent negatives include no chest pain, no abdominal pain and no headaches. Nothing aggravates the symptoms. He has tried nothing for the symptoms.  ? ?  ? ?Home Medications ?Prior to Admission medications   ?Medication Sig Start Date End Date Taking? Authorizing Provider  ?acetaminophen (TYLENOL) 325 MG tablet Take 650 mg by mouth every 6 (six) hours as needed for mild pain or moderate pain.   Yes [provider]  ?sulfamethoxazole-trimethoprim (BACTRIM DS) 800-160 MG tablet Take 1 pill twice a day 02/10/22  Yes Milton Ferguson, MD  ?ciprofloxacin (CIPRO) 500 MG tablet Take 1 tablet (500 mg total) by mouth 2 (two) times daily. ?Patient not taking: Reported on 02/10/2022 10/26/17   Dorie Rank, MD  ?diazepam (VALIUM) 5 MG tablet Take 1 tablet (5 mg total) by mouth 2 (two) times daily. ?Patient not taking: Reported on 02/10/2022 09/20/20   Isla Pence, MD  ?HYDROcodone-acetaminophen (NORCO/VICODIN) 5-325 MG tablet Take 1 tablet by mouth every 4 (four) hours as needed for moderate pain. ?Patient not taking: Reported on 02/10/2022 08/26/21 08/26/22  Fransico Meadow, PA-C  ?ibuprofen (ADVIL) 600 MG tablet Take 1 tablet (600 mg total) by mouth every 6 (six) hours as needed. ?Patient not taking: Reported on 02/10/2022 09/20/20   Isla Pence, MD  ?meclizine (ANTIVERT) 25 MG tablet Take 1 tablet (25 mg total) by mouth 3 (three)  times daily as needed for dizziness. ?Patient not taking: Reported on 02/10/2022 01/07/18   Fredia Sorrow, MD  ?metroNIDAZOLE (FLAGYL) 500 MG tablet Take 1 tablet (500 mg total) by mouth 3 (three) times daily. ?Patient not taking: Reported on 02/10/2022 10/26/17   Dorie Rank, MD  ?naproxen (NAPROSYN) 500 MG tablet Take 1 tablet (500 mg total) by mouth 2 (two) times daily. ?Patient not taking: Reported on 02/10/2022 10/26/17   Dorie Rank, MD  ?predniSONE (DELTASONE) 50 MG tablet One tablet a day for 5 days ?Patient not taking: Reported on 02/10/2022 08/26/21   Fransico Meadow, PA-C  ?   ? ?Allergies    ?Pineapple and Coconut flavor   ? ?Review of Systems   ?Review of Systems  ?Constitutional:  Negative for appetite change and fatigue.  ?HENT:  Negative for congestion, ear discharge and sinus pressure.   ?Eyes:  Negative for discharge.  ?Respiratory:  Negative for cough.   ?Cardiovascular:  Negative for chest pain.  ?Gastrointestinal:  Negative for abdominal pain and diarrhea.  ?Genitourinary:  Negative for frequency and hematuria.  ?Musculoskeletal:  Negative for back pain.  ?     Left foot pain  ?Skin:  Negative for rash.  ?Neurological:  Negative for seizures and headaches.  ?Psychiatric/Behavioral:  Negative for hallucinations.   ? ?Physical Exam ?Updated Vital Signs ?BP 126/83   Pulse 74   Temp 98.1 ?F (36.7 ?C) (Oral)   Resp 20   Ht 5\' 10"  (1.778 m)  Wt 72.6 kg   SpO2 100%   BMI 22.96 kg/m?  ?Physical Exam ?Vitals and nursing note reviewed.  ?Constitutional:   ?   Appearance: He is well-developed.  ?HENT:  ?   Head: Normocephalic.  ?   Nose: Nose normal.  ?Eyes:  ?   General: No scleral icterus. ?   Conjunctiva/sclera: Conjunctivae normal.  ?Neck:  ?   Thyroid: No thyromegaly.  ?Cardiovascular:  ?   Rate and Rhythm: Normal rate and regular rhythm.  ?   Heart sounds: No murmur heard. ?  No friction rub. No gallop.  ?Pulmonary:  ?   Breath sounds: No stridor. No wheezing or rales.  ?Chest:  ?   Chest wall: No  tenderness.  ?Abdominal:  ?   General: There is no distension.  ?   Tenderness: There is no abdominal tenderness. There is no rebound.  ?Musculoskeletal:     ?   General: Normal range of motion.  ?   Cervical back: Neck supple.  ?Lymphadenopathy:  ?   Cervical: No cervical adenopathy.  ?Skin: ?   General: Skin is warm.  ?   Findings: Erythema present. No rash.  ?   Comments: Left foot with cellulitis  ?Neurological:  ?   Mental Status: He is alert and oriented to person, place, and time.  ?   Motor: No abnormal muscle tone.  ?   Coordination: Coordination normal.  ?Psychiatric:     ?   Behavior: Behavior normal.  ? ? ?ED Results / Procedures / Treatments   ?Labs ?(all labs ordered are listed, but only abnormal results are displayed) ?Labs Reviewed  ?CBC - Abnormal; Notable for the following components:  ?    Result Value  ? WBC 10.8 (*)   ? All other components within normal limits  ?BASIC METABOLIC PANEL - Abnormal; Notable for the following components:  ? Potassium 3.2 (*)   ? All other components within normal limits  ? ? ?EKG ?None ? ?Radiology ?No results found. ? ?Procedures ?Procedures  ? ? ?Medications Ordered in ED ?Medications  ?vancomycin (VANCOCIN) IVPB 1000 mg/200 mL premix (0 mg Intravenous Stopped 02/10/22 1047)  ?Tdap (BOOSTRIX) injection 0.5 mL (0.5 mLs Intramuscular Given 02/10/22 0943)  ?diphenhydrAMINE (BENADRYL) injection 25 mg (25 mg Intravenous Given 02/10/22 1138)  ? ? ?ED Course/ Medical Decision Making/ A&P ?  ?                        ?Medical Decision Making ?Amount and/or Complexity of Data Reviewed ?Labs: ordered. ? ?Risk ?Prescription drug management. ? ?This patient presents to the ED for concern of left foot pain, this involves an extensive number of treatment options, and is a complaint that carries with it a high risk of complications and morbidity.  The differential diagnosis includes cellulitis of left foot or fracture of the left foot ? ? ?Co morbidities that complicate the patient  evaluation ? ?None ? ? ?Additional history obtained: ? ?Additional history obtained from patient ?External records from outside source obtained and reviewed including hospital record ? ? ?Lab Tests: ? ?I Ordered, and personally interpreted labs.  The pertinent results include: CBC and chemistry which showed mild elevation white count 10.8 ? ? ?Imaging Studies ordered: ? ?None ? ?Cardiac Monitoring: ? ?The patient was maintained on a cardiac monitor.  I personally viewed and interpreted the cardiac monitored which showed an underlying rhythm of: Normal sinus ? ? ?Medicines ordered and prescription drug  management: ? ?I ordered medication including vancomycin for cellulitis ?Reevaluation of the patient after these medicines showed that the patient stayed the same ?I have reviewed the patients home medicines and have made adjustments as needed ? ? ?Test Considered: ? ?MRI of foot ? ? ?Critical Interventions: ? ?IV antibiotic ? ? ?Consultations Obtained: ?No consult ?Problem List / ED Course: ? ?Cellulitis of foot ? ? ? ?Social Determinants of Health: ? ?None ? ? ? ? ? ? ?Patient with cellulitis left foot.  He is given vancomycin here and had some itching.  He was discharged home with Bactrim and follow-up this week ? ? ? ? ? ? ? ?Final Clinical Impression(s) / ED Diagnoses ?Final diagnoses:  ?Cellulitis of foot  ? ? ?Rx / DC Orders ?ED Discharge Orders   ? ?      Ordered  ?  sulfamethoxazole-trimethoprim (BACTRIM DS) 800-160 MG tablet       ? 02/10/22 1500  ? ?  ?  ? ?  ? ? ?  ?Milton Ferguson, MD ?02/11/22 1046 ? ?

## 2022-02-10 NOTE — Discharge Instructions (Signed)
Follow-up the beginning of the week with your doctor for recheck.  If your foot gets worse over the weekend then return here.  Start the antibiotics today ?

## 2022-02-10 NOTE — ED Notes (Signed)
Resting quietly with eyes closed. No signs of distress. VSS.  ?

## 2022-02-10 NOTE — ED Notes (Signed)
Resting quietly. No signs of distress. VSS. °

## 2022-05-27 ENCOUNTER — Encounter (HOSPITAL_COMMUNITY)
Admission: EM | Disposition: A | Discharge status: Home / Self Care | Payer: Self-pay | Source: Home / Self Care | Attending: Emergency Medicine

## 2022-05-27 ENCOUNTER — Other Ambulatory Visit: Payer: Self-pay

## 2022-05-27 ENCOUNTER — Emergency Department (HOSPITAL_COMMUNITY): Payer: Medicaid Other

## 2022-05-27 ENCOUNTER — Emergency Department (EMERGENCY_DEPARTMENT_HOSPITAL): Payer: Medicaid Other | Admitting: Anesthesiology

## 2022-05-27 ENCOUNTER — Encounter (HOSPITAL_COMMUNITY): Payer: Self-pay

## 2022-05-27 ENCOUNTER — Ambulatory Visit (HOSPITAL_COMMUNITY)
Admission: EM | Admit: 2022-05-27 | Discharge: 2022-05-27 | Disposition: A | Discharge status: Home / Self Care | Payer: Medicaid Other | Attending: Emergency Medicine | Admitting: Emergency Medicine

## 2022-05-27 ENCOUNTER — Emergency Department (HOSPITAL_COMMUNITY): Payer: Medicaid Other | Admitting: Anesthesiology

## 2022-05-27 DIAGNOSIS — S61412A Laceration without foreign body of left hand, initial encounter: Secondary | ICD-10-CM | POA: Diagnosis not present

## 2022-05-27 DIAGNOSIS — S62522B Displaced fracture of distal phalanx of left thumb, initial encounter for open fracture: Secondary | ICD-10-CM

## 2022-05-27 DIAGNOSIS — W231XXA Caught, crushed, jammed, or pinched between stationary objects, initial encounter: Secondary | ICD-10-CM | POA: Diagnosis not present

## 2022-05-27 DIAGNOSIS — I491 Atrial premature depolarization: Secondary | ICD-10-CM | POA: Diagnosis not present

## 2022-05-27 DIAGNOSIS — F1290 Cannabis use, unspecified, uncomplicated: Secondary | ICD-10-CM | POA: Insufficient documentation

## 2022-05-27 HISTORY — PX: STUMP REVISION: SHX6102

## 2022-05-27 SURGERY — REVISION, AMPUTATION SITE
Anesthesia: General | Site: Thumb | Laterality: Left

## 2022-05-27 MED ORDER — CEFAZOLIN SODIUM-DEXTROSE 2-3 GM-%(50ML) IV SOLR
INTRAVENOUS | Status: DC | PRN
  Administered 2022-05-27: 2 g via INTRAVENOUS

## 2022-05-27 MED ORDER — FENTANYL CITRATE (PF) 250 MCG/5ML IJ SOLN
INTRAMUSCULAR | Status: AC
  Filled 2022-05-27: qty 5

## 2022-05-27 MED ORDER — FENTANYL CITRATE (PF) 250 MCG/5ML IJ SOLN
INTRAMUSCULAR | Status: DC | PRN
Start: 2022-05-27 — End: 2022-05-27
  Administered 2022-05-27: 50 ug via INTRAVENOUS
  Administered 2022-05-27: 100 ug via INTRAVENOUS

## 2022-05-27 MED ORDER — BUPIVACAINE HCL (PF) 0.25 % IJ SOLN
INTRAMUSCULAR | Status: DC | PRN
  Administered 2022-05-27: 8 mL via INTRA_ARTICULAR

## 2022-05-27 MED ORDER — SODIUM CHLORIDE 0.9 % IR SOLN
Status: DC | PRN
  Administered 2022-05-27: 3000 mL

## 2022-05-27 MED ORDER — CEFAZOLIN SODIUM-DEXTROSE 2-4 GM/100ML-% IV SOLN
2.0000 g | Freq: Once | INTRAVENOUS | Status: AC
  Administered 2022-05-27: 2 g via INTRAVENOUS
  Filled 2022-05-27: qty 100

## 2022-05-27 MED ORDER — DEXMEDETOMIDINE (PRECEDEX) IN NS 20 MCG/5ML (4 MCG/ML) IV SYRINGE
PREFILLED_SYRINGE | INTRAVENOUS | Status: DC | PRN
  Administered 2022-05-27: 8 ug via INTRAVENOUS
  Administered 2022-05-27: 12 ug via INTRAVENOUS

## 2022-05-27 MED ORDER — 0.9 % SODIUM CHLORIDE (POUR BTL) OPTIME
TOPICAL | Status: DC | PRN
  Administered 2022-05-27: 1000 mL

## 2022-05-27 MED ORDER — ONDANSETRON HCL 4 MG/2ML IJ SOLN
INTRAMUSCULAR | Status: DC | PRN
  Administered 2022-05-27: 4 mg via INTRAVENOUS

## 2022-05-27 MED ORDER — PROPOFOL 10 MG/ML IV BOLUS
INTRAVENOUS | Status: AC
  Filled 2022-05-27: qty 20

## 2022-05-27 MED ORDER — BUPIVACAINE HCL 0.5 % IJ SOLN
50.0000 mL | Freq: Once | INTRAMUSCULAR | Status: AC
  Administered 2022-05-27: 50 mL
  Filled 2022-05-27: qty 50

## 2022-05-27 MED ORDER — CHLORHEXIDINE GLUCONATE 0.12 % MT SOLN: 15.0000 mL | Freq: Once | OROMUCOSAL | Status: AC

## 2022-05-27 MED ORDER — PROPOFOL 10 MG/ML IV BOLUS
INTRAVENOUS | Status: DC | PRN
  Administered 2022-05-27: 200 mg via INTRAVENOUS

## 2022-05-27 MED ORDER — MIDAZOLAM HCL 2 MG/2ML IJ SOLN
INTRAMUSCULAR | Status: AC
  Filled 2022-05-27: qty 2

## 2022-05-27 MED ORDER — LACTATED RINGERS IV SOLN: INTRAVENOUS | Status: DC

## 2022-05-27 MED ORDER — OXYCODONE-ACETAMINOPHEN 5-325 MG PO TABS
2.0000 | ORAL_TABLET | Freq: Once | ORAL | Status: AC
  Administered 2022-05-27: 2 via ORAL
  Filled 2022-05-27: qty 2

## 2022-05-27 MED ORDER — BACITRACIN ZINC 500 UNIT/GM EX OINT
TOPICAL_OINTMENT | CUTANEOUS | Status: DC | PRN
  Administered 2022-05-27: 1 via TOPICAL

## 2022-05-27 MED ORDER — DEXAMETHASONE SODIUM PHOSPHATE 10 MG/ML IJ SOLN
INTRAMUSCULAR | Status: DC | PRN
  Administered 2022-05-27: 10 mg via INTRAVENOUS

## 2022-05-27 MED ORDER — BACITRACIN ZINC 500 UNIT/GM EX OINT
TOPICAL_OINTMENT | CUTANEOUS | Status: AC
Start: 2022-05-27 — End: ?
  Filled 2022-05-27: qty 28.35

## 2022-05-27 MED ORDER — BUPIVACAINE HCL (PF) 0.25 % IJ SOLN
INTRAMUSCULAR | Status: AC
  Filled 2022-05-27: qty 30

## 2022-05-27 MED ORDER — ONDANSETRON HCL 4 MG/2ML IJ SOLN
INTRAMUSCULAR | Status: AC
  Filled 2022-05-27: qty 2

## 2022-05-27 MED ORDER — ORAL CARE MOUTH RINSE: 15.0000 mL | Freq: Once | OROMUCOSAL | Status: AC

## 2022-05-27 MED ORDER — MIDAZOLAM HCL 2 MG/2ML IJ SOLN
INTRAMUSCULAR | Status: DC | PRN
  Administered 2022-05-27: 2 mg via INTRAVENOUS

## 2022-05-27 MED ORDER — CHLORHEXIDINE GLUCONATE 0.12 % MT SOLN
OROMUCOSAL | Status: AC
  Administered 2022-05-27: 15 mL via OROMUCOSAL
  Filled 2022-05-27: qty 15

## 2022-05-27 MED ORDER — FENTANYL CITRATE (PF) 100 MCG/2ML IJ SOLN: 25.0000 ug | INTRAMUSCULAR | Status: DC | PRN

## 2022-05-27 MED ORDER — DEXAMETHASONE SODIUM PHOSPHATE 10 MG/ML IJ SOLN
INTRAMUSCULAR | Status: AC
Start: 2022-05-27 — End: ?
  Filled 2022-05-27: qty 1

## 2022-05-27 MED ORDER — OXYCODONE-ACETAMINOPHEN 5-325 MG PO TABS: 1.0000 | ORAL_TABLET | Freq: Four times a day (QID) | ORAL | 0 refills | Status: AC | PRN

## 2022-05-27 MED ORDER — LIDOCAINE 2% (20 MG/ML) 5 ML SYRINGE
INTRAMUSCULAR | Status: DC | PRN
  Administered 2022-05-27: 80 mg via INTRAVENOUS

## 2022-05-27 SURGICAL SUPPLY — 62 items
BAG COUNTER SPONGE SURGICOUNT (BAG) ×1 IMPLANT
BNDG COHESIVE 1X5 TAN STRL LF (GAUZE/BANDAGES/DRESSINGS) ×1 IMPLANT
BNDG CONFORM 2 STRL LF (GAUZE/BANDAGES/DRESSINGS) IMPLANT
BNDG ELASTIC 3X5.8 VLCR STR LF (GAUZE/BANDAGES/DRESSINGS) ×1 IMPLANT
BNDG ELASTIC 4X5.8 VLCR STR LF (GAUZE/BANDAGES/DRESSINGS) ×1 IMPLANT
BNDG ESMARK 4X9 LF (GAUZE/BANDAGES/DRESSINGS) ×2 IMPLANT
BNDG GAUZE ELAST 4 BULKY (GAUZE/BANDAGES/DRESSINGS) ×1 IMPLANT
BNDG STRETCH GAUZE 3IN X12FT (GAUZE/BANDAGES/DRESSINGS) ×1 IMPLANT
CORD BIPOLAR FORCEPS 12FT (ELECTRODE) ×2 IMPLANT
COVER SURGICAL LIGHT HANDLE (MISCELLANEOUS) ×2 IMPLANT
CUFF TOURN SGL QUICK 18X4 (TOURNIQUET CUFF) ×2 IMPLANT
CUFF TOURN SGL QUICK 24 (TOURNIQUET CUFF)
CUFF TRNQT CYL 24X4X16.5-23 (TOURNIQUET CUFF) IMPLANT
DRAIN PENROSE 1/4X12 LTX STRL (WOUND CARE) IMPLANT
DRAPE SURG 17X23 STRL (DRAPES) ×2 IMPLANT
DRSG ADAPTIC 3X8 NADH LF (GAUZE/BANDAGES/DRESSINGS) ×1 IMPLANT
DRSG EMULSION OIL 3X3 NADH (GAUZE/BANDAGES/DRESSINGS) ×1 IMPLANT
ELECT REM PT RETURN 9FT ADLT (ELECTROSURGICAL)
ELECTRODE REM PT RTRN 9FT ADLT (ELECTROSURGICAL) IMPLANT
GAUZE SPONGE 4X4 12PLY STRL (GAUZE/BANDAGES/DRESSINGS) ×2 IMPLANT
GAUZE XEROFORM 1X8 LF (GAUZE/BANDAGES/DRESSINGS) ×1 IMPLANT
GAUZE XEROFORM 5X9 LF (GAUZE/BANDAGES/DRESSINGS) IMPLANT
GLOVE BIOGEL PI IND STRL 8.5 (GLOVE) ×1 IMPLANT
GLOVE BIOGEL PI INDICATOR 8.5 (GLOVE) ×1
GLOVE SURG ORTHO 8.0 STRL STRW (GLOVE) ×2 IMPLANT
GLOVE SURG UNDER POLY LF SZ7.5 (GLOVE) ×4 IMPLANT
GOWN STRL REUS W/ TWL LRG LVL3 (GOWN DISPOSABLE) ×3 IMPLANT
GOWN STRL REUS W/ TWL XL LVL3 (GOWN DISPOSABLE) ×1 IMPLANT
GOWN STRL REUS W/TWL LRG LVL3 (GOWN DISPOSABLE) ×3
GOWN STRL REUS W/TWL XL LVL3 (GOWN DISPOSABLE) ×1
HANDPIECE INTERPULSE COAX TIP (DISPOSABLE)
K-WIRE .035 (WIRE) ×4
KIT BASIN OR (CUSTOM PROCEDURE TRAY) ×2 IMPLANT
KIT TURNOVER KIT B (KITS) ×2 IMPLANT
KWIRE .035 (WIRE) ×2 IMPLANT
KWIRE FIXATION L4 .035 (WIRE) IMPLANT
MANIFOLD NEPTUNE II (INSTRUMENTS) ×2 IMPLANT
NDL HYPO 25GX1X1/2 BEV (NEEDLE) IMPLANT
NEEDLE HYPO 25GX1X1/2 BEV (NEEDLE) IMPLANT
NS IRRIG 1000ML POUR BTL (IV SOLUTION) ×2 IMPLANT
PACK ORTHO EXTREMITY (CUSTOM PROCEDURE TRAY) ×2 IMPLANT
PAD ARMBOARD 7.5X6 YLW CONV (MISCELLANEOUS) ×4 IMPLANT
PAD CAST 4YDX4 CTTN HI CHSV (CAST SUPPLIES) ×1 IMPLANT
PADDING CAST COTTON 4X4 STRL (CAST SUPPLIES)
SET CYSTO W/LG BORE CLAMP LF (SET/KITS/TRAYS/PACK) IMPLANT
SET HNDPC FAN SPRY TIP SCT (DISPOSABLE) IMPLANT
SOAP 2 % CHG 4 OZ (WOUND CARE) ×2 IMPLANT
SPONGE T-LAP 18X18 ~~LOC~~+RFID (SPONGE) ×2 IMPLANT
SPONGE T-LAP 4X18 ~~LOC~~+RFID (SPONGE) ×2 IMPLANT
SUT CHROMIC 4 0 RB 1X27 (SUTURE) ×1 IMPLANT
SUT ETHILON 4 0 PS 2 18 (SUTURE) IMPLANT
SUT ETHILON 5 0 P 3 18 (SUTURE)
SUT NYLON ETHILON 5-0 P-3 1X18 (SUTURE) IMPLANT
SWAB COLLECTION DEVICE MRSA (MISCELLANEOUS) ×2 IMPLANT
SWAB CULTURE ESWAB REG 1ML (MISCELLANEOUS) IMPLANT
SYR CONTROL 10ML LL (SYRINGE) IMPLANT
TOWEL GREEN STERILE (TOWEL DISPOSABLE) ×2 IMPLANT
TOWEL GREEN STERILE FF (TOWEL DISPOSABLE) ×2 IMPLANT
TUBE CONNECTING 12X1/4 (SUCTIONS) ×2 IMPLANT
UNDERPAD 30X36 HEAVY ABSORB (UNDERPADS AND DIAPERS) ×2 IMPLANT
WATER STERILE IRR 1000ML POUR (IV SOLUTION) ×2 IMPLANT
YANKAUER SUCT BULB TIP NO VENT (SUCTIONS) ×2 IMPLANT

## 2022-05-27 NOTE — Anesthesia Preprocedure Evaluation (Signed)
Anesthesia Evaluation  Patient identified by MRN, date of birth, ID band Patient awake    Reviewed: Allergy & Precautions, NPO status , Patient's Chart, lab work & pertinent test results  Airway Mallampati: I  TM Distance: >3 FB Neck ROM: Full    Dental no notable dental hx.    Pulmonary neg pulmonary ROS,    Pulmonary exam normal        Cardiovascular negative cardio ROS   Rhythm:Regular Rate:Normal     Neuro/Psych negative neurological ROS  negative psych ROS   GI/Hepatic negative GI ROS, Neg liver ROS,   Endo/Other  negative endocrine ROS  Renal/GU   negative genitourinary   Musculoskeletal Thumb amputation    Abdominal Normal abdominal exam  (+)   Peds  Hematology negative hematology ROS (+)   Anesthesia Other Findings   Reproductive/Obstetrics                             Anesthesia Physical Anesthesia Plan  ASA: 1 and emergent  Anesthesia Plan: General   Post-op Pain Management:    Induction: Intravenous  PONV Risk Score and Plan: 2 and Ondansetron, Dexamethasone, Midazolam and Treatment may vary due to age or medical condition  Airway Management Planned: Mask and LMA  Additional Equipment: None  Intra-op Plan:   Post-operative Plan: Extubation in OR  Informed Consent: I have reviewed the patients History and Physical, chart, labs and discussed the procedure including the risks, benefits and alternatives for the proposed anesthesia with the patient or authorized representative who has indicated his/her understanding and acceptance.     Dental advisory given  Plan Discussed with: CRNA  Anesthesia Plan Comments:         Anesthesia Quick Evaluation

## 2022-05-27 NOTE — ED Notes (Signed)
Pt left facility at this time

## 2022-05-27 NOTE — Anesthesia Procedure Notes (Signed)
Procedure Name: LMA Insertion Date/Time: 05/27/2022 8:35 PM  Performed by: Garfield Cornea, CRNAPre-anesthesia Checklist: Patient identified, Emergency Drugs available, Suction available and Patient being monitored Patient Re-evaluated:Patient Re-evaluated prior to induction Oxygen Delivery Method: Circle System Utilized Preoxygenation: Pre-oxygenation with 100% oxygen Induction Type: IV induction LMA: LMA inserted LMA Size: 4.0 Number of attempts: 1 Placement Confirmation: positive ETCO2 Tube secured with: Tape Dental Injury: Teeth and Oropharynx as per pre-operative assessment

## 2022-05-27 NOTE — ED Notes (Addendum)
[facility] called for transport at this time. ED to shortstay.

## 2022-05-27 NOTE — Discharge Instructions (Signed)
  Orthopaedic Hand Surgery Discharge Instructions  WEIGHT BEARING STATUS: Non weight bearing on operative extremity  DRESSING CARE: Please keep your dressing/splint/cast clean and dry until your follow-up appointment. You may shower by placing a waterproof covering over your dressing/splint/cast. Contact your surgeon if your splint/cast gets wet. It will need to be changed to prevent skin breakdown.  PAIN CONTROL: First line medications for post operative pain control are Tylenol (acetaminophen) and Motrin (ibuprofen) if you are able to take these medications. If you have been prescribed a medication these can be taken as breakthrough pain medications. Please note that some narcotic pain medication has acetaminophen added and you should never consume more than 4,000mg of acetaminophen in 24-hour period. Please note that if you are given Toradol (ketorolac) you should not take similar medications such as ibuprofen or naproxen.  DISCHARGE MEDICATIONS: If you have been prescribed medication it was sent electronically to your pharmacy. No changes have been made to your home medications.  ICE/ELEVATION: Ice and elevate your injured extremity as needed. Avoid direct contact of ice with skin.   BANDAGE FEELS TOO TIGHT: If your bandage feels too tight, first make sure you are elevating your fingers as much as possible. The outer layer of the bandage can be unwrapped and reapplied more loosely. If no improvement, you may carefully cut the inner layer longitudinally until the pressure has resolved and then rewrap the outer layer. If you are not comfortable with these instructions, please call the office and the bandage can be changed for you.   FOLLOW UP: You will be called after surgery with an appointment date and time, however if you have not received a phone call within 3 days, please call during regular office hours at [phone] to schedule a post operative appointment.  Please Seek Medical Attention  if: Call MD for: pain or pressure in chest, jaw, arm, back, neck  Call MD for: temperature greater than 101 F for more than 24 hrs Call MD for: difficulty breathing Call MD for: incision redness, bleeding, drainage  Call MD for: palpitations or feeling that the heart is racing  Call MD for: increased swelling in arm, leg, ankle, or abdomen  Call MD for: lightheadedness, dizziness, fainting Call 911 or go to ER for any medical emergency if you are not able to get in touch with your doctor   J. Reid Saulo Anthis, MD Orthopaedic Hand Surgeon [facility] Office number: [phone] [address] [place], [place] [address]  

## 2022-05-27 NOTE — Transfer of Care (Signed)
Immediate Anesthesia Transfer of Care Note  Patient: Miguel Spears  Procedure(s) Performed: AMPUTATION REVISION LEFT THUMB (Left)  Patient Location: PACU  Anesthesia Type:General  Level of Consciousness: drowsy  Airway & Oxygen Therapy: Patient Spontanous Breathing and Patient connected to face mask oxygen  Post-op Assessment: Report given to RN and Post -op Vital signs reviewed and stable  Post vital signs: Reviewed and stable  Last Vitals:  Vitals Value Taken Time  BP 140/94 05/27/22 2145  Temp    Pulse 64 05/27/22 2147  Resp 10 05/27/22 2147  SpO2 100 % 05/27/22 2147  Vitals shown include unvalidated device data.  Last Pain:  Vitals:   05/27/22 2009  TempSrc:   PainSc: 8       Patients Stated Pain Goal: 3 (05/27/22 2009)  Complications: No notable events documented.

## 2022-05-27 NOTE — ED Triage Notes (Signed)
Pt presents to ED with laceration to left thumb after smashing in gate.

## 2022-05-27 NOTE — ED Notes (Signed)
Pt transported to [facility] short stay via [facility] at this time.

## 2022-05-27 NOTE — Anesthesia Postprocedure Evaluation (Signed)
Anesthesia Post Note  Patient: Miguel Spears  Procedure(s) Performed: REVISION OF LEFT THUMB PARTIAL AMPUTATION (Left: Thumb)     Patient location during evaluation: PACU Anesthesia Type: General Level of consciousness: awake and alert Pain management: pain level controlled Vital Signs Assessment: post-procedure vital signs reviewed and stable Respiratory status: spontaneous breathing, nonlabored ventilation, respiratory function stable and patient connected to nasal cannula oxygen Cardiovascular status: blood pressure returned to baseline and stable Postop Assessment: no apparent nausea or vomiting Anesthetic complications: no   No notable events documented.  Last Vitals:  Vitals:   05/27/22 2215 05/27/22 2230  BP: (!) 132/92 125/87  Pulse: 62 60  Resp: 11 (!) 9  Temp:    SpO2: 99% 100%    Last Pain:  Vitals:   05/27/22 2215  TempSrc:   PainSc: 0-No pain                 Nelle Don Takerra Lupinacci

## 2022-05-27 NOTE — Op Note (Signed)
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 male who has the above preoperative diagnosis. The patient has decided to proceed with surgical intervention.  Risks, benefits and alternatives of operative management were discussed including, but not limited to, risks of anesthesia complications, infection, pain, persistent symptoms, stiffness, need for future surgery.  The patient understands, agrees and elects to proceed with surgery.    DESCRIPTION OF PROCEDURE: The patient was met in the pre-operative area and their identity was verified.  The operative location and laterality was also verified and marked.  The patient was brought to the OR and was placed supine on the table.  After repeat patient identification with the operative team anesthesia was provided and the patient was prepped and draped in the usual sterile fashion.  A final timeout was performed verifying the correction patient, procedure, location and laterality.  The procedure began by thorough irrigation of the left thumb and exploration of the wound and assessment of perfusion.  There was concern for a dusky thumb and was a dysvascular nature thus he was transferred to our hospital for surgery.  After thorough irrigation and evaluation of the thumb the ulnar digital artery to the left thumb was intact and there was brisk capillary refill to the pulp of the thumb.  Irrigation  and excisional debridement of the left thumb open distal phalanx fracture was performed with excisional debridement with a scalpel of the skin, subcutaneous tissue and devitalized bone fragments.  At this time the distal phalanx fracture was open reduced and open reduction internal fixation was performed with K wires via an antegrade retrograde technique.  The distal phalanx was pinned and this went across the IP joint for further stability.  2 K wires were placed in divergent fashion for improved rotational stability.  At this time the traumatic wound was closed with 4-0 chromic sutures to restore the contour of the left thumb.  A nailbed repair was performed with chromic sutures in interrupted fashion.  The entire nail plate was removed prior to repair.  This was too damaged for replacement.  There was significant soft tissue loss over the dorsal aspect of the thumb distal phalanx however there was some nailbed that was able to be repaired and had adequate coverage over the bone.  The K wires were cut beneath the skin.  The wound was again thoroughly irrigated and evaluated and had adequate perfusion.  A sterile soft bandage was applied followed by a splint.  The patient was then awoken from anesthesia brought to PACU for recovery in stable condition.  All counts were correct x2.  Postoperative plan: Patient will discharge home tonight he will have strict elevation rest and nonweightbearing to the left thumb.  No use of the left thumb until he returns for follow-up in my office on Monday for a repeat x-ray and wound check.  We will plan to get him a tip protector splint across the IP joint to hold the IP joint still while the K wires are  in place these will likely remain in place for 4 to 6 weeks while the fracture heals.   Matt Holmes, MD

## 2022-05-27 NOTE — ED Provider Notes (Signed)
[facility] EMERGENCY DEPARTMENT Provider Note   CSN: [id number] Arrival date & time: 05/27/22  1309     History  Chief Complaint  Patient presents with   Laceration    Miguel Spears is a 31 y.o. male.  Pt reports a gate crushed his finger.  Pt complains of laceration and pain   The history is provided by the patient. No language interpreter was used.  Laceration Location:  Finger Finger laceration location:  L thumb Depth:  Through muscle Quality: jagged and stellate   Bleeding: controlled   Time since incident:  1 hour Laceration mechanism:  Blunt object Pain details:    Quality:  Aching   Timing:  Constant   Progression:  Worsening Foreign body present:  No foreign bodies Relieved by:  Nothing Worsened by:  Nothing Tetanus status:  Up to date      Home Medications Prior to Admission medications   Medication Sig Start Date End Date Taking? Authorizing Provider  acetaminophen (TYLENOL) 325 MG tablet Take 650 mg by mouth every 6 (six) hours as needed for mild pain or moderate pain.    [provider]      Allergies    Pineapple and Coconut flavor    Review of Systems   Review of Systems  Skin:  Positive for wound.  All other systems reviewed and are negative.   Physical Exam Updated Vital Signs BP (!) 146/117   Pulse 83   Resp (!) 24   Ht 5\' 11"  (1.803 m)   Wt 68 kg   SpO2 100%   BMI 20.92 kg/m  Physical Exam Vitals and nursing note reviewed.  Constitutional:      Appearance: He is well-developed.  HENT:     Head: Normocephalic.  Pulmonary:     Effort: Pulmonary effort is normal.  Abdominal:     General: There is no distension.  Musculoskeletal:        General: Deformity present.     Cervical back: Normal range of motion.     Comments: Decreased cap refill,  deformity 2.5 cm laceration  decreased range of motion,    Neurological:     General: No focal deficit present.     Mental Status: He is alert and oriented to person,  place, and time.  Psychiatric:        Mood and Affect: Mood normal.     ED Results / Procedures / Treatments   Labs (all labs ordered are listed, but only abnormal results are displayed) Labs Reviewed - No data to display  EKG None  Radiology DG Finger Thumb Left  Result Date: 05/27/2022 CLINICAL DATA:  Left thumb injury. EXAM: LEFT THUMB 2+V COMPARISON:  None Available. FINDINGS: There is a comminuted, significantly displaced fracture of the distal phalanx of the thumb with extension to the proximal articular surface and evidence of surrounding soft tissue injury. No dislocation, radiopaque foreign body, or soft tissue gas is evident. Soft tissue swelling extends proximally along the first metacarpal. IMPRESSION: Comminuted, displaced distal phalanx fracture of the thumb. Electronically Signed   By: 05/29/2022 M.D.   On: 05/27/2022 14:05    Procedures .Nerve Block  Date/Time: 05/27/2022 3:35 PM  Performed by: 05/29/2022, PA-C Authorized by: Elson Areas, PA-C   Consent:    Consent obtained:  Verbal   Consent given by:  Patient   Risks, benefits, and alternatives were discussed: yes     Risks discussed:  Pain Universal protocol:  Procedure explained and questions answered to patient or proxy's satisfaction: yes     Immediately prior to procedure, a time out was called: yes     Patient identity confirmed:  Verbally with patient Pre-procedure details:    Skin preparation:  Alcohol   Preparation: Patient was prepped and draped in usual sterile fashion   Skin anesthesia:    Skin anesthesia method:  None Procedure details:    Block needle gauge:  27 G   Anesthetic injected:  Bupivacaine 0.5% w/o epi   Additive injected:  None Post-procedure details:    Dressing:  None   Outcome:  Anesthesia achieved   Procedure completion:  Tolerated     Medications Ordered in ED Medications  bupivacaine (MARCAINE) 0.5 % (with pres) injection 50 mL (50 mLs Infiltration Given  05/27/22 1333)  oxyCODONE-acetaminophen (PERCOCET/ROXICET) 5-325 MG per tablet 2 tablet (2 tablets Oral Given 05/27/22 1331)    ED Course/ Medical Decision Making/ A&P                           Medical Decision Making Amount and/or Complexity of Data Reviewed Radiology: ordered and independent interpretation performed. Decision-making details documented in ED Course.    Details: X-ray shows fracture distal phalanx with displacement Discussion of management or test interpretation with external provider(s): I spoke with Dale Normanna, PA on-call for hand surgery he spoke with Dr. Yehuda Budd who advised attempt transfer to [facility] with Dr. Mickie Hillier plastic hand surgeon on call at [facility] who felt there was no benefit to their specialty care area is below the DIP.  Dr. Burnard [facility] advised transfer to [facility], ED. Digital block performed.  Pt given percocet 2 tablets.  Iv ancef.  Risk Prescription drug management.                Final Clinical Impression(s) / ED Diagnoses Final diagnoses:  Open fracture of base of distal phalanx of left thumb    Rx / DC Orders ED Discharge Orders     None         Osie Cheeks 05/27/22 1542    Bethann Berkshire, MD 05/27/22 1742

## 2022-05-27 NOTE — Consult Note (Signed)
Orthopedic Hand Surgery Consultation:  Reason for Consult: Left thumb partial amputation Referring Physician: Dr. Estell Harpin   HPI: Miguel Spears is a(an) 31 y.o. male who presents after catching his left and a metal gate.  This caused acute pain swelling and bleeding and lack of sensation to the tip of the thumb.  Pain is worse with activity and improved with rest.  Patient was transferred from outside hospital after the transfer to [facility] was declined for possible replantation.  The patient was transferred to [facility] for evaluation by me.   Physical Exam: Left Upper Extremity The left thumb is dusky in appearance with lack of sensation or blood flow to the tip of the thumb.  There is exposed bone clear nailbed deformity.  There is a complex laceration about the tip of the left thumb.  X-ray 3 views left thumb shows a comminuted distal phalanx fracture with displacement.  Assessment/Plan: Left thumb partial amputation.  We discussed the pros and cons of possible revascularization/replantation versus revision amputation.  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  Patient is right-hand dominant.  The patient understands the situation and states that if we can try to save it he would like that done if not he understands and is fine with revision amputation.  The risks and benefits of surgery were carefully explained including, but not limited to risks of infection, injury to nerves, blood vessels, neighboring structures, recurrence or continued symptoms, loss of motion or strength and the need for rehabilitation or further surgery. Additional risks that are particular to this surgery include nonunion, malunion, re-fracture, hardware complications, need for hardware removal, Loss of digit, possible ICU stay and lack of function even with saving digit. After thorough discussion and all questions were answered  informed consent was obtained.     Mathis Dad, MD Orthopaedic Hand Surgeon [facility] Office number: [phone] [address], [address] [place], [place] [address]    Past Medical History:  Diagnosis Date   Kidney stone     Past Surgical History:  Procedure Laterality Date   CYST REMOVAL PEDIATRIC      History reviewed. No pertinent family history.  Social History:  reports that he has never smoked. He has never used smokeless tobacco. He reports current alcohol use. He reports current drug use. Drug: Marijuana.  Allergies:  Allergies  Allergen Reactions   Pineapple Swelling and Other (See Comments)    Swelling, numbness, and hives   Coconut Flavor Rash    Medications: reviewed, no changes to patient's home medications  No results found for this or any previous visit (from the past 48 hour(s)).  DG MINI C-ARM IMAGE ONLY  Result Date: 05/27/2022 There is no interpretation for this exam.  This order is for images obtained during a surgical procedure.  Please See "Surgeries" Tab for more information regarding the procedure.   DG Finger Thumb Left  Result Date: 05/27/2022 CLINICAL DATA:  Left thumb injury. EXAM: LEFT THUMB 2+V COMPARISON:  None Available. FINDINGS: There is a comminuted, significantly displaced fracture of the distal phalanx of the thumb with extension to the proximal articular surface and evidence of surrounding soft tissue injury. No dislocation, radiopaque foreign body, or soft tissue gas is evident. Soft tissue swelling extends proximally along the first metacarpal. IMPRESSION: Comminuted, displaced distal phalanx fracture of the thumb. Electronically Signed   By: Sebastian Ache M.D.   On: 05/27/2022 14:05    ROS:  14 point review of systems negative except per HPI

## 2022-05-28 ENCOUNTER — Other Ambulatory Visit: Payer: Self-pay

## 2022-05-28 ENCOUNTER — Encounter (HOSPITAL_COMMUNITY): Payer: Self-pay | Admitting: Orthopedic Surgery

## 2022-06-28 ENCOUNTER — Ambulatory Visit
Admission: EM | Admit: 2022-06-28 | Discharge: 2022-06-28 | Disposition: A | Discharge status: Home / Self Care | Payer: Medicaid Other

## 2022-06-28 DIAGNOSIS — K0889 Other specified disorders of teeth and supporting structures: Secondary | ICD-10-CM | POA: Diagnosis not present

## 2022-06-28 MED ORDER — IBUPROFEN 800 MG PO TABS: 800.0000 mg | ORAL_TABLET | Freq: Three times a day (TID) | ORAL | 0 refills | Status: DC

## 2022-06-28 MED ORDER — AMOXICILLIN-POT CLAVULANATE 875-125 MG PO TABS: 1.0000 | ORAL_TABLET | Freq: Two times a day (BID) | ORAL | 0 refills | Status: AC

## 2022-06-28 NOTE — ED Provider Notes (Signed)
[facility]    CSN: [id number] Arrival date & time: 06/28/22  1354      History   Chief Complaint Chief Complaint  Patient presents with   Abscess    Tooth in sever pain. - Entered by patient    HPI Miguel Spears is a 31 y.o. male.   Patient presents for 1 week of dental pain that has acutely worsened in the past couple of days.  Reports the pain is in the back of his upper jaw on the left.  Denies any recent trauma to the face or injury in the mouth.  Reports the pain is severe and throbbing.  Denies fevers, swelling, redness, drainage inside the mouth, numbness/tingling, and sinus pressure.  He has taken Tylenol and ibuprofen without much relief.  Does not have a dentist.      Past Medical History:  Diagnosis Date   Kidney stone     There are no problems to display for this patient.   Past Surgical History:  Procedure Laterality Date   CYST REMOVAL PEDIATRIC     STUMP REVISION Left 05/27/2022   Procedure: 1.Left thumb I&D of open distal phalanx fracture 2.Left thumb distal phalanx ORIF 3.Left thumb nailbed repair 4.Left thumb four view radiographs with intraoperative interpretation;  Surgeon: Gomez Cleverly, MD;  Location: [facility] OR;  Service: Orthopedics;  Laterality: Left;       Home Medications    Prior to Admission medications   Medication Sig Start Date End Date Taking? Authorizing Provider  acetaminophen (TYLENOL) 500 MG tablet Take 500 mg by mouth every 6 (six) hours as needed.   Yes [provider]  amoxicillin-clavulanate (AUGMENTIN) 875-125 MG tablet Take 1 tablet by mouth 2 (two) times daily for 7 days. 06/28/22 07/05/22 Yes Valentino Nose, NP  ibuprofen (ADVIL) 800 MG tablet Take 1 tablet (800 mg total) by mouth 3 (three) times daily. Take with food to prevent GI upset 06/28/22  Yes Valentino Nose, NP    Family History History reviewed. No pertinent family history.  Social History Social History   Tobacco Use   Smoking  status: Never   Smokeless tobacco: Never  Vaping Use   Vaping Use: Never used  Substance Use Topics   Alcohol use: Yes    Comment: occasionally   Drug use: Yes    Types: Marijuana     Allergies   Pineapple and Coconut flavor   Review of Systems Review of Systems Per HPI  Physical Exam Triage Vital Signs ED Triage Vitals  Enc Vitals Group     BP 06/28/22 1408 136/70     Pulse Rate 06/28/22 1408 71     Resp 06/28/22 1408 18     Temp 06/28/22 1408 97.9 F (36.6 C)     Temp Source 06/28/22 1408 Oral     SpO2 06/28/22 1408 98 %     Weight --      Height --      Head Circumference --      Peak Flow --      Pain Score 06/28/22 1406 9     Pain Loc --      Pain Edu? --      Excl. in GC? --    No data found.  Updated Vital Signs BP 136/70 (BP Location: Right Arm)   Pulse 71   Temp 97.9 F (36.6 C) (Oral)   Resp 18   SpO2 98%   Visual Acuity Right Eye Distance:  Left Eye Distance:   Bilateral Distance:    Right Eye Near:   Left Eye Near:    Bilateral Near:     Physical Exam Vitals and nursing note reviewed.  Constitutional:      General: He is not in acute distress.    Appearance: Normal appearance. He is not toxic-appearing.  HENT:     Head: Normocephalic and atraumatic.     Nose: Nose normal. No congestion.     Mouth/Throat:     Mouth: Mucous membranes are moist.     Dentition: Dental tenderness and gingival swelling present. No dental abscesses or gum lesions.     Pharynx: Oropharynx is clear. No posterior oropharyngeal erythema.      Comments: Dental pain in the area marked; no obvious abscess, redness, drainage, or area of fluctuance Eyes:     General: No scleral icterus.    Extraocular Movements: Extraocular movements intact.  Pulmonary:     Effort: Pulmonary effort is normal. No respiratory distress.  Skin:    General: Skin is warm and dry.     Capillary Refill: Capillary refill takes less than 2 seconds.     Coloration: Skin is not  jaundiced or pale.     Findings: No erythema.  Neurological:     Mental Status: He is alert and oriented to person, place, and time.  Psychiatric:        Behavior: Behavior is cooperative.      UC Treatments / Results  Labs (all labs ordered are listed, but only abnormal results are displayed) Labs Reviewed - No data to display  EKG   Radiology No results found.  Procedures Procedures (including critical care time)  Medications Ordered in UC Medications - No data to display  Initial Impression / Assessment and Plan / UC Course  I have reviewed the triage vital signs and the nursing notes.  Pertinent labs & imaging results that were available during my care of the patient were reviewed by me and considered in my medical decision making (see chart for details).    Patient is an uncomfortable-appearing 31 year old male presenting for dental pain today.  Will treat for possible dental infection with Augmentin twice daily for 7 days.  Encouraged use of NSAID/Tylenol regimen to help with pain control as well as ice.  Encouraged close follow up with Dentist in 1-2 weeks.  The patient was given the opportunity to ask questions.  All questions answered to their satisfaction.  The patient is in agreement to this plan.    Final Clinical Impressions(s) / UC Diagnoses   Final diagnoses:  Pain, dental     Discharge Instructions      - The pain in your tooth is likely from infection - Please take the entire course of antibiotic to help clear this up - You can take Tylenol [phone] mg every 6 hours alternating with ibuprofen 800 mg every 8 hours as needed for pain - Can also use ice/cool compresses to help with pain - Follow up with a dentist; resource list is at the end of this paperwork    ED Prescriptions     Medication Sig Dispense Auth. Provider   amoxicillin-clavulanate (AUGMENTIN) 875-125 MG tablet Take 1 tablet by mouth 2 (two) times daily for 7 days. 14 tablet  Cathlean Marseilles A, NP   ibuprofen (ADVIL) 800 MG tablet Take 1 tablet (800 mg total) by mouth 3 (three) times daily. Take with food to prevent GI upset 21 tablet Bradly Bienenstock,  Guadlupe Spanish, NP      PDMP not reviewed this encounter.   Valentino Nose, NP 06/28/22 1430

## 2022-06-28 NOTE — Discharge Instructions (Addendum)
-   The pain in your tooth is likely from infection - Please take the entire course of antibiotic to help clear this up - You can take Tylenol [phone] mg every 6 hours alternating with ibuprofen 800 mg every 8 hours as needed for pain - Can also use ice/cool compresses to help with pain - Follow up with a dentist; resource list is at the end of this paperwork

## 2022-06-28 NOTE — ED Triage Notes (Signed)
Pt reports dental pain x 2 month, worse in the past 2 days. Tylenol and ibuprofen gives no relief.

## 2022-12-06 ENCOUNTER — Encounter (INDEPENDENT_AMBULATORY_CARE_PROVIDER_SITE_OTHER): Payer: Self-pay

## 2023-05-01 ENCOUNTER — Encounter (HOSPITAL_COMMUNITY): Payer: Self-pay | Admitting: Emergency Medicine

## 2023-05-01 ENCOUNTER — Emergency Department (HOSPITAL_COMMUNITY)
Admission: EM | Admit: 2023-05-01 | Discharge: 2023-05-01 | Disposition: A | Discharge status: Home / Self Care | Payer: Medicaid Other | Attending: Emergency Medicine | Admitting: Emergency Medicine

## 2023-05-01 ENCOUNTER — Other Ambulatory Visit: Payer: Self-pay

## 2023-05-01 DIAGNOSIS — L0100 Impetigo, unspecified: Secondary | ICD-10-CM

## 2023-05-01 MED ORDER — IBUPROFEN 800 MG PO TABS
800.0000 mg | ORAL_TABLET | Freq: Once | ORAL | Status: AC
  Administered 2023-05-01: 800 mg via ORAL
  Filled 2023-05-01: qty 1

## 2023-05-01 MED ORDER — IBUPROFEN 600 MG PO TABS: 600.0000 mg | ORAL_TABLET | Freq: Three times a day (TID) | ORAL | 0 refills | Status: AC

## 2023-05-01 MED ORDER — SULFAMETHOXAZOLE-TRIMETHOPRIM 800-160 MG PO TABS: 1.0000 | ORAL_TABLET | Freq: Two times a day (BID) | ORAL | 0 refills | Status: AC

## 2023-05-01 MED ORDER — MUPIROCIN 2 % EX OINT: TOPICAL_OINTMENT | Freq: Two times a day (BID) | CUTANEOUS | 0 refills | Status: AC

## 2023-05-01 MED ORDER — HYDROCODONE-ACETAMINOPHEN 5-325 MG PO TABS: 1.0000 | ORAL_TABLET | Freq: Four times a day (QID) | ORAL | 0 refills | Status: AC | PRN

## 2023-05-01 MED ORDER — SULFAMETHOXAZOLE-TRIMETHOPRIM 800-160 MG PO TABS
1.0000 | ORAL_TABLET | Freq: Once | ORAL | Status: AC
  Administered 2023-05-01: 1 via ORAL
  Filled 2023-05-01: qty 1

## 2023-05-01 NOTE — ED Triage Notes (Signed)
Pt c/o L side check abscess that started 4 days ago. States he was mowing and soemthing flew and hit him in the face, states it has progressively gotten worse with swelling and pain and only experiences relief when he takes a hot shower. L side of check noted to be swollen and red.

## 2023-05-01 NOTE — Discharge Instructions (Signed)
Taking next dose of the antibiotic this evening.  Use caution with the hydrocodone pain medication as this will make you drowsy, do not drive within 4 hours of taking this medication.  I do recommend continued warm soaks as you are doing twice daily.  You have also been prescribed a topical antibiotic ointment.  Get rechecked if your symptoms are not improving with this treatment plan.  There is no exam findings to suggest there is an abscess that needs to be lanced today.

## 2023-05-01 NOTE — ED Provider Notes (Signed)
[facility] Provider Note   CSN: [id number] Arrival date & time: 05/01/23  4270     History  Chief Complaint  Patient presents with   Abscess    Miguel Spears is a 32 y.o. male presenting for left facial infection which started 4 days ago.  States he was weed eating when something flew and hit him in the face and caused an abrasion at his left cheek.  Since that time it has become swollen, red and tender and there is been small pockets of pus draining from the site.  He has taken hot showers, directly to the site which does offer some relief.  He woke this morning with worse pain and spreading of the infection into his beard.  He denies fevers or chills, no other complaints.  The history is provided by the patient.       Home Medications Prior to Admission medications   Medication Sig Start Date End Date Taking? Authorizing Provider  HYDROcodone-acetaminophen (NORCO/VICODIN) 5-325 MG tablet Take 1 tablet by mouth every 6 (six) hours as needed for severe pain. 05/01/23  Yes Delia Slatten, Raynelle Fanning, PA-C  ibuprofen (ADVIL) 600 MG tablet Take 1 tablet (600 mg total) by mouth 3 (three) times daily. 05/01/23  Yes Jeremie Abdelaziz, Raynelle Fanning, PA-C  mupirocin ointment (BACTROBAN) 2 % Apply topically 2 (two) times daily for 10 days. 05/01/23 05/11/23 Yes Jowanna Loeffler, Raynelle Fanning, PA-C  sulfamethoxazole-trimethoprim (BACTRIM DS) 800-160 MG tablet Take 1 tablet by mouth 2 (two) times daily for 10 days. 05/01/23 05/11/23 Yes Zuleyka Kloc, Raynelle Fanning, PA-C  acetaminophen (TYLENOL) 500 MG tablet Take 500 mg by mouth every 6 (six) hours as needed.    [provider]      Allergies    Pineapple and Coconut flavor    Review of Systems   Review of Systems  Constitutional:  Negative for chills and fever.  HENT:  Negative for congestion and sore throat.   Eyes: Negative.   Respiratory:  Negative for chest tightness and shortness of breath.   Cardiovascular:  Negative for chest pain.   Gastrointestinal:  Negative for abdominal pain and nausea.  Genitourinary: Negative.   Musculoskeletal:  Negative for arthralgias, joint swelling and neck pain.  Skin:  Positive for color change and wound. Negative for rash.  Neurological:  Negative for dizziness, weakness, light-headedness, numbness and headaches.  Psychiatric/Behavioral: Negative.    All other systems reviewed and are negative.   Physical Exam Updated Vital Signs BP (!) 147/91   Pulse 76   Temp 99 F (37.2 C) (Oral)   Resp 18   Ht 5\' 11"  (1.803 m)   Wt 77.1 kg   SpO2 100%   BMI 23.71 kg/m  Physical Exam Vitals and nursing note reviewed.  Constitutional:      Appearance: He is well-developed.  HENT:     Head: Normocephalic.     Left Ear: Tympanic membrane normal.     Nose: Nose normal.  Eyes:     Conjunctiva/sclera: Conjunctivae normal.  Cardiovascular:     Rate and Rhythm: Normal rate.  Pulmonary:     Effort: Pulmonary effort is normal.  Musculoskeletal:        General: Normal range of motion.     Cervical back: Normal range of motion.  Skin:    General: Skin is warm and dry.     Comments: Quarter sized induration left cheek with scabbing, multiple tiny pustules and honey crusted scabbing.  No fluctuance.  Smaller scab within  the beard also left sided.   Neurological:     Mental Status: He is alert.     ED Results / Procedures / Treatments   Labs (all labs ordered are listed, but only abnormal results are displayed) Labs Reviewed - No data to display  EKG None  Radiology No results found.  Procedures Procedures    Medications Ordered in ED Medications  ibuprofen (ADVIL) tablet 800 mg (800 mg Oral Given 05/01/23 0927)  sulfamethoxazole-trimethoprim (BACTRIM DS) 800-160 MG per tablet 1 tablet (1 tablet Oral Given 05/01/23 1324)    ED Course/ Medical Decision Making/ A&P                             Medical Decision Making Exam and history suggesting impetigo of the cheek.  There is  no fluctuance to suggest a drainable abscess.  He does have tiny pustules on the surface of this wound.  He is encouraged to continue warm soaks, Bactrim x 10 days, mupirocin was also prescribed for topical use.  Plan close follow-up for any persistent or worsening symptoms.  Risk Prescription drug management.           Final Clinical Impression(s) / ED Diagnoses Final diagnoses:  Impetigo    Rx / DC Orders ED Discharge Orders          Ordered    sulfamethoxazole-trimethoprim (BACTRIM DS) 800-160 MG tablet  2 times daily        05/01/23 0916    ibuprofen (ADVIL) 600 MG tablet  3 times daily        05/01/23 0916    mupirocin ointment (BACTROBAN) 2 %  2 times daily        05/01/23 0916    HYDROcodone-acetaminophen (NORCO/VICODIN) 5-325 MG tablet  Every 6 hours PRN        05/01/23 0916              Burgess Amor, PA-C 05/01/23 1014    Bethann Berkshire, MD 05/04/23 1039
